# Patient Record
Sex: Male | Born: 1970 | Race: White | Hispanic: No | Marital: Single | Smoking: Current every day smoker
Health system: Southern US, Community
[De-identification: ages and names within clinical notes are randomized; demographics above are authoritative.]

## PROBLEM LIST (undated history)

## (undated) DIAGNOSIS — I10 Essential (primary) hypertension: Secondary | ICD-10-CM

## (undated) DIAGNOSIS — M109 Gout, unspecified: Secondary | ICD-10-CM

## (undated) DIAGNOSIS — F419 Anxiety disorder, unspecified: Secondary | ICD-10-CM

## (undated) HISTORY — PX: KNEE ARTHROCENTESIS: SUR44

## (undated) HISTORY — PX: APPENDECTOMY: SHX54

## (undated) HISTORY — PX: SHOULDER ARTHROSCOPY: SHX128

---

## 2000-08-18 ENCOUNTER — Encounter: Payer: Self-pay | Admitting: Neurosurgery

## 2000-08-18 ENCOUNTER — Ambulatory Visit (HOSPITAL_COMMUNITY): Admission: RE | Admit: 2000-08-18 | Discharge: 2000-08-18 | Payer: Self-pay | Admitting: Neurosurgery

## 2000-09-02 ENCOUNTER — Encounter: Payer: Self-pay | Admitting: Neurosurgery

## 2000-09-02 ENCOUNTER — Ambulatory Visit (HOSPITAL_COMMUNITY): Admission: RE | Admit: 2000-09-02 | Discharge: 2000-09-02 | Payer: Self-pay | Admitting: Neurosurgery

## 2005-12-12 ENCOUNTER — Emergency Department (HOSPITAL_COMMUNITY): Admission: EM | Admit: 2005-12-12 | Discharge: 2005-12-12 | Payer: Self-pay | Admitting: Emergency Medicine

## 2006-04-02 ENCOUNTER — Emergency Department (HOSPITAL_COMMUNITY): Admission: EM | Admit: 2006-04-02 | Discharge: 2006-04-02 | Payer: Self-pay | Admitting: Emergency Medicine

## 2006-07-09 ENCOUNTER — Emergency Department (HOSPITAL_COMMUNITY): Admission: EM | Admit: 2006-07-09 | Discharge: 2006-07-09 | Payer: Self-pay | Admitting: Emergency Medicine

## 2009-11-14 ENCOUNTER — Emergency Department (HOSPITAL_BASED_OUTPATIENT_CLINIC_OR_DEPARTMENT_OTHER): Admission: EM | Admit: 2009-11-14 | Discharge: 2009-11-14 | Payer: Self-pay | Admitting: Emergency Medicine

## 2009-11-14 ENCOUNTER — Ambulatory Visit: Payer: Self-pay | Admitting: Diagnostic Radiology

## 2010-01-08 ENCOUNTER — Emergency Department (HOSPITAL_COMMUNITY): Admission: EM | Admit: 2010-01-08 | Discharge: 2010-01-08 | Payer: Self-pay | Admitting: Emergency Medicine

## 2010-01-12 ENCOUNTER — Emergency Department (HOSPITAL_COMMUNITY): Admission: EM | Admit: 2010-01-12 | Discharge: 2010-01-12 | Payer: Self-pay | Admitting: Emergency Medicine

## 2010-03-18 ENCOUNTER — Emergency Department (HOSPITAL_COMMUNITY): Admission: EM | Admit: 2010-03-18 | Discharge: 2010-03-18 | Payer: Self-pay | Admitting: Emergency Medicine

## 2010-07-20 ENCOUNTER — Emergency Department (HOSPITAL_COMMUNITY)
Admission: EM | Admit: 2010-07-20 | Discharge: 2010-07-20 | Payer: Self-pay | Source: Home / Self Care | Admitting: Emergency Medicine

## 2010-11-23 ENCOUNTER — Emergency Department (HOSPITAL_COMMUNITY)
Admission: EM | Admit: 2010-11-23 | Discharge: 2010-11-23 | Disposition: A | Discharge status: Home / Self Care | Payer: Self-pay | Attending: Emergency Medicine | Admitting: Emergency Medicine

## 2010-11-23 DIAGNOSIS — L02619 Cutaneous abscess of unspecified foot: Secondary | ICD-10-CM | POA: Insufficient documentation

## 2010-11-23 DIAGNOSIS — I10 Essential (primary) hypertension: Secondary | ICD-10-CM | POA: Insufficient documentation

## 2010-11-23 DIAGNOSIS — Z79899 Other long term (current) drug therapy: Secondary | ICD-10-CM | POA: Insufficient documentation

## 2010-11-23 DIAGNOSIS — L03039 Cellulitis of unspecified toe: Secondary | ICD-10-CM | POA: Insufficient documentation

## 2010-12-16 ENCOUNTER — Emergency Department (HOSPITAL_COMMUNITY): Payer: Self-pay

## 2010-12-16 ENCOUNTER — Emergency Department (HOSPITAL_COMMUNITY)
Admission: EM | Admit: 2010-12-16 | Discharge: 2010-12-16 | Disposition: A | Discharge status: Home / Self Care | Payer: Self-pay | Attending: Emergency Medicine | Admitting: Emergency Medicine

## 2010-12-16 DIAGNOSIS — Z882 Allergy status to sulfonamides status: Secondary | ICD-10-CM | POA: Insufficient documentation

## 2010-12-16 DIAGNOSIS — L02619 Cutaneous abscess of unspecified foot: Secondary | ICD-10-CM | POA: Insufficient documentation

## 2010-12-16 DIAGNOSIS — I10 Essential (primary) hypertension: Secondary | ICD-10-CM | POA: Insufficient documentation

## 2010-12-16 DIAGNOSIS — F17200 Nicotine dependence, unspecified, uncomplicated: Secondary | ICD-10-CM | POA: Insufficient documentation

## 2010-12-16 DIAGNOSIS — F411 Generalized anxiety disorder: Secondary | ICD-10-CM | POA: Insufficient documentation

## 2010-12-16 DIAGNOSIS — J45909 Unspecified asthma, uncomplicated: Secondary | ICD-10-CM | POA: Insufficient documentation

## 2010-12-16 DIAGNOSIS — L03039 Cellulitis of unspecified toe: Secondary | ICD-10-CM | POA: Insufficient documentation

## 2010-12-16 DIAGNOSIS — E785 Hyperlipidemia, unspecified: Secondary | ICD-10-CM | POA: Insufficient documentation

## 2010-12-16 LAB — GLUCOSE, CAPILLARY

## 2013-10-15 ENCOUNTER — Emergency Department (HOSPITAL_COMMUNITY)
Admission: EM | Admit: 2013-10-15 | Discharge: 2013-10-15 | Disposition: A | Discharge status: Home / Self Care | Payer: Self-pay | Attending: Emergency Medicine | Admitting: Emergency Medicine

## 2013-10-15 ENCOUNTER — Emergency Department (HOSPITAL_COMMUNITY): Payer: Self-pay

## 2013-10-15 ENCOUNTER — Encounter (HOSPITAL_COMMUNITY): Payer: Self-pay | Admitting: Emergency Medicine

## 2013-10-15 DIAGNOSIS — M109 Gout, unspecified: Secondary | ICD-10-CM | POA: Insufficient documentation

## 2013-10-15 DIAGNOSIS — F17200 Nicotine dependence, unspecified, uncomplicated: Secondary | ICD-10-CM | POA: Insufficient documentation

## 2013-10-15 DIAGNOSIS — I10 Essential (primary) hypertension: Secondary | ICD-10-CM | POA: Insufficient documentation

## 2013-10-15 DIAGNOSIS — H7290 Unspecified perforation of tympanic membrane, unspecified ear: Secondary | ICD-10-CM | POA: Insufficient documentation

## 2013-10-15 DIAGNOSIS — Z79899 Other long term (current) drug therapy: Secondary | ICD-10-CM | POA: Insufficient documentation

## 2013-10-15 DIAGNOSIS — H6690 Otitis media, unspecified, unspecified ear: Secondary | ICD-10-CM | POA: Insufficient documentation

## 2013-10-15 DIAGNOSIS — R42 Dizziness and giddiness: Secondary | ICD-10-CM | POA: Insufficient documentation

## 2013-10-15 DIAGNOSIS — R05 Cough: Secondary | ICD-10-CM | POA: Insufficient documentation

## 2013-10-15 DIAGNOSIS — R059 Cough, unspecified: Secondary | ICD-10-CM | POA: Insufficient documentation

## 2013-10-15 DIAGNOSIS — R110 Nausea: Secondary | ICD-10-CM | POA: Insufficient documentation

## 2013-10-15 HISTORY — DX: Essential (primary) hypertension: I10

## 2013-10-15 HISTORY — DX: Gout, unspecified: M10.9

## 2013-10-15 MED ORDER — BENZONATATE 100 MG PO CAPS: 100.0000 mg | ORAL_CAPSULE | Freq: Three times a day (TID) | ORAL | Status: DC

## 2013-10-15 MED ORDER — HYDROCODONE-ACETAMINOPHEN 5-325 MG PO TABS: 1.0000 | ORAL_TABLET | ORAL | Status: DC | PRN

## 2013-10-15 MED ORDER — AMOXICILLIN-POT CLAVULANATE 875-125 MG PO TABS: 1.0000 | ORAL_TABLET | Freq: Two times a day (BID) | ORAL | Status: DC

## 2013-10-15 NOTE — Discharge Instructions (Signed)
It is important that you follow up with the ear, nose, throat doctor to be sure your ear clears and to test your hearing. Return here for any problems. Take your medication as directed. Do not take the narcotic if you are driving as it will make you sleepy.   Draining Ear Ear wax, pus, blood and other fluids are examples of the different types of drainage from ears. Drops or cream may be needed to lessen the itching which may occur with ear drainage. CAUSES   Skin irritations in the ear.  Ear infection.  Swimmer's ear.  Ruptured eardrum.  Foreign object in the ear canal.  Sudden pressure changes.  Head injury. HOME CARE INSTRUCTIONS   Only take over-the-counter or prescription medicines for pain, fever, or discomfort as directed by your caregiver.  Do not rub the ear canal with cotton-tipped swabs.  Do not swim until your caregiver says it is okay.  Before you take a shower, cover a cotton ball with petroleum jelly to keep water out.  Limit exposure to smoke. Secondhand smoke can increase the chance for ear infections.  Keep up with immunizations.  Wash your hands well.  Keep all follow-up appointments to examine the ear and evaluate hearing. SEEK MEDICAL CARE IF:   You have increased drainage.  You have ear pain, a fever, or drainage that is not getting better after 48 hours of antibiotics.  You are unusually tired. SEEK IMMEDIATE MEDICAL CARE IF:  You have severe ear pain or headache.  The patient is older than 3 months with a rectal or oral temperature of 102 F (38.9 C) or higher.  The patient is 213 months old or younger with a rectal temperature of 100.4 F (38 C) or higher.  You vomit.  You feel dizzy.  You have a seizure.  You have new hearing loss. MAKE SURE YOU:   Understand these instructions.  Will watch your condition.  Will get help right away if you are not doing well or get worse. Document Released: 06/22/2005 Document Revised:  09/14/2011 Document Reviewed: 04/25/2009 [facility] Patient Information 2014 [place]Care, [place].  Eardrum Perforation The eardrum is a thin, round tissue inside the ear. It allows you to hear. The eardrum can get torn (perforated). Eardrums often heal on their own. There is often little or no long-term hearing loss. HOME CARE   Keep your ear dry while it heals. Do not swim, dive, or take showers until your doctor says it is okay.  Before you take a bath, put petroleum jelly all over a cotton ball. Put the cotton ball in your ear. This will keep water out.  Only take medicines as told by your doctor.  Blow your nose gently.  Continue normal activities after your eardrum heals. Your doctor will tell you when your eardrum has healed.  Talk to your doctor before flying on an airplane.  Keep all doctor visits as told. This is important. GET HELP RIGHT AWAY IF:   You have blood or yellowish-white fluid (pus) coming from your ear.  You feel off balance.  You feel dizzy, sick to your stomach (nauseous), or you throw up (vomit).  You have more pain.  You have a fever. MAKE SURE YOU:   Understand these instructions.  Will watch your condition.  Will get help right away if you are not doing well or get worse. Document Released: 12/10/2009 Document Revised: 09/14/2011 Document Reviewed: 12/10/2009 [facility] Patient Information 2014 [place]ExitCare, [place]LLC.

## 2013-10-15 NOTE — ED Provider Notes (Signed)
CSN: [id number]     Arrival date & time 10/15/13  1245 History  This chart was scribed for non-physician practitioner, Kerrie BuffaloHope Neese, FNP,working with Benny LennertJoseph L Zammit, MD, by Karle PlumberJennifer Tensley, ED Scribe.  This patient was seen in room APFT21/APFT21 and the patient's care was started at 2:03 PM.  Chief Complaint  Patient presents with  . Ear Drainage   The history is provided by the patient. No language interpreter was used.   HPI Comments:  Shauna HughKevin D Clymer is a 43 y.o. male who presents to the Emergency Department complaining of productive cough of yellowish-green phlegm and congestion that started approximately four days ago. He reports bilateral ear bleeding and pain that started two days ago. He reports fever ranging from 100-101 degrees. He reports sinus congestion, mild nausea and dizziness. Pt states he had a cold about one week ago. 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 He denies vomiting.  Past Medical History  Diagnosis Date  . Hypertension   . Gout    Past Surgical History  Procedure Laterality Date  . Knee arthrocentesis    . Shoulder arthroscopy     History reviewed. No pertinent family history. History  Substance Use Topics  . Smoking status: Current Every Day Smoker -- 2.00 packs/day  . Smokeless tobacco: Not on file  . Alcohol Use: Not on file    Review of Systems  Constitutional: Positive for fever.  HENT: Positive for congestion, ear discharge (bilateral), ear pain (right) and sinus pressure. Negative for facial swelling, sore throat and trouble swallowing.   Eyes: Negative for visual disturbance.  Respiratory: Positive for cough. Negative for shortness of breath.   Cardiovascular: Negative for chest pain.  Gastrointestinal: Positive for nausea. Negative for vomiting and abdominal pain.  Musculoskeletal: Negative for neck stiffness.  Skin: Negative for rash.  Neurological: Positive for dizziness. Negative for seizures.  Psychiatric/Behavioral:  Negative for confusion.    Allergies  Sulfur  Home Medications   Current Outpatient Rx  Name  Route  Sig  Dispense  Refill  . bumetanide (BUMEX) 1 MG tablet   Oral   Take 1 mg by mouth daily.         . DULoxetine (CYMBALTA) 60 MG capsule   Oral   Take 60 mg by mouth daily.         . indomethacin (INDOCIN) 50 MG capsule   Oral   Take 50 mg by mouth 2 (two) times daily as needed (gout flareups).         Marland Kitchen. PRESCRIPTION MEDICATION      Amlodipine but patient does not know dose.  States he is also on another blood pressure medication but is not sure of name. His pharmacy is closed today.          Triage Vitals: BP 146/82  Pulse 63  Temp(Src) 97.7 F (36.5 C) (Oral)  Resp 18  Ht 6\' 4"  (1.93 m)  Wt 290 lb (131.543 kg)  BMI 35.31 kg/m2  SpO2 99% Physical Exam  Nursing note and vitals reviewed. Constitutional: He is oriented to person, place, and time. He appears well-developed and well-nourished.  HENT:  Right Ear: There is drainage and tenderness. Tympanic membrane is perforated. Decreased hearing is noted.  Left Ear: There is drainage and tenderness. Tympanic membrane is perforated. Decreased hearing is noted.  Eyes: Conjunctivae and EOM are normal.  Neck: Neck supple.  Cardiovascular: Normal rate and regular rhythm.   Pulmonary/Chest: Effort normal. He has rhonchi.  Abdominal: Soft.  There is no tenderness.  Musculoskeletal: Normal range of motion.  Neurological: He is alert and oriented to person, place, and time. No cranial nerve deficit.  Skin: Skin is warm and dry.  Psychiatric: He has a normal mood and affect. His behavior is normal.    ED Course  Procedures (including critical care time) DIAGNOSTIC STUDIES: Oxygen Saturation is 99% on RA, normal by my interpretation.   COORDINATION OF CARE: 2:10 PM- Will order CXR and prescribe antibiotics and pain medication. Pt verbalizes understanding and agrees to plan.  Medications - No data to display  Labs  Review Labs Reviewed - No data to display Imaging Review Dg Chest 2 View  10/15/2013   CLINICAL DATA:  Cough for 4 days  EXAM: CHEST  2 VIEW  COMPARISON:  DG CHEST 2 VIEW dated 07/09/2006  FINDINGS: Mild cardiac enlargement stable. Vascular pattern normal. Lungs clear. No pleural effusions.  IMPRESSION: No active cardiopulmonary disease.   Electronically Signed   By: Esperanza Heir M.D.   On: 10/15/2013 14:38    MDM   43 y.o. male with bilateral otitis media and drainage from ears. Will treat with antibiotics and pain medication. Discussed in detail with the patient need for follow up with ENT as soon as possible. Discussed hearing loss associated with ear infections in adults.  Patient voices understanding.  I have reviewed this patient's vital signs, nurses notes and discussed clinical findings with the patient and plan of care. He voices understanding.    Medication List    TAKE these medications       amoxicillin-clavulanate 875-125 MG per tablet  Commonly known as:  AUGMENTIN  Take 1 tablet by mouth 2 (two) times daily.     benzonatate 100 MG capsule  Commonly known as:  TESSALON  Take 1 capsule (100 mg total) by mouth every 8 (eight) hours.     HYDROcodone-acetaminophen 5-325 MG per tablet  Commonly known as:  NORCO/VICODIN  Take 1 tablet by mouth every 4 (four) hours as needed.      ASK your doctor about these medications       bumetanide 1 MG tablet  Commonly known as:  BUMEX  Take 1 mg by mouth daily.     DULoxetine 60 MG capsule  Commonly known as:  CYMBALTA  Take 60 mg by mouth daily.     indomethacin 50 MG capsule  Commonly known as:  INDOCIN  Take 50 mg by mouth 2 (two) times daily as needed (gout flareups).     PRESCRIPTION MEDICATION  Amlodipine but patient does not know dose.  States he is also on another blood pressure medication but is not sure of name. His pharmacy is closed today.        I personally performed the services described in this  documentation, which was scribed in my presence. The recorded information has been reviewed and is accurate.    [facility] Orlene Och, NP 10/15/13 1700

## 2013-10-15 NOTE — ED Notes (Signed)
Pt s/o reports pt has had cough x4 days. Pt has been taking otc mucinex with no relief. Pt reports muffled sounds,drainage and bleeding from bilateral ears started yesterday. Pt denies any known injury. Pt reports dizziness, right ear pain. Pt s/o reports that pt ruptured both ear drums x3 years ago.

## 2013-10-18 NOTE — ED Provider Notes (Signed)
Medical screening examination/treatment/procedure(s) were performed by non-physician practitioner and as supervising physician I was immediately available for consultation/collaboration.   EKG Interpretation None        Benny LennertJoseph L Marshia Tropea, MD 10/18/13 [phone]

## 2014-02-02 ENCOUNTER — Other Ambulatory Visit: Payer: Self-pay | Admitting: Occupational Medicine

## 2014-02-02 ENCOUNTER — Ambulatory Visit: Payer: Self-pay

## 2014-02-02 DIAGNOSIS — R52 Pain, unspecified: Secondary | ICD-10-CM

## 2014-02-07 ENCOUNTER — Other Ambulatory Visit: Payer: Self-pay | Admitting: Occupational Medicine

## 2014-02-07 ENCOUNTER — Ambulatory Visit: Payer: Self-pay

## 2014-02-07 DIAGNOSIS — R52 Pain, unspecified: Secondary | ICD-10-CM

## 2014-11-25 ENCOUNTER — Emergency Department (HOSPITAL_COMMUNITY)
Admission: EM | Admit: 2014-11-25 | Discharge: 2014-11-25 | Disposition: A | Discharge status: Home / Self Care | Payer: BLUE CROSS/BLUE SHIELD | Attending: Emergency Medicine | Admitting: Emergency Medicine

## 2014-11-25 ENCOUNTER — Encounter (HOSPITAL_COMMUNITY): Payer: Self-pay | Admitting: Emergency Medicine

## 2014-11-25 DIAGNOSIS — Z791 Long term (current) use of non-steroidal anti-inflammatories (NSAID): Secondary | ICD-10-CM | POA: Diagnosis not present

## 2014-11-25 DIAGNOSIS — I10 Essential (primary) hypertension: Secondary | ICD-10-CM | POA: Diagnosis not present

## 2014-11-25 DIAGNOSIS — M109 Gout, unspecified: Secondary | ICD-10-CM | POA: Diagnosis not present

## 2014-11-25 DIAGNOSIS — F419 Anxiety disorder, unspecified: Secondary | ICD-10-CM | POA: Insufficient documentation

## 2014-11-25 DIAGNOSIS — Z79899 Other long term (current) drug therapy: Secondary | ICD-10-CM | POA: Insufficient documentation

## 2014-11-25 DIAGNOSIS — Z720 Tobacco use: Secondary | ICD-10-CM | POA: Insufficient documentation

## 2014-11-25 DIAGNOSIS — Z760 Encounter for issue of repeat prescription: Secondary | ICD-10-CM | POA: Diagnosis present

## 2014-11-25 HISTORY — DX: Anxiety disorder, unspecified: F41.9

## 2014-11-25 MED ORDER — BISOPROLOL-HYDROCHLOROTHIAZIDE 5-6.25 MG PO TABS: 1.0000 | ORAL_TABLET | Freq: Two times a day (BID) | ORAL | Status: DC

## 2014-11-25 MED ORDER — BENAZEPRIL HCL 40 MG PO TABS: 40.0000 mg | ORAL_TABLET | Freq: Every day | ORAL | Status: DC

## 2014-11-25 MED ORDER — AMLODIPINE BESY-BENAZEPRIL HCL 5-40 MG PO CAPS: 1.0000 | ORAL_CAPSULE | Freq: Every day | ORAL | Status: DC

## 2014-11-25 MED ORDER — AMLODIPINE BESYLATE 5 MG PO TABS
5.0000 mg | ORAL_TABLET | Freq: Once | ORAL | Status: AC
  Administered 2014-11-25: 5 mg via ORAL
  Filled 2014-11-25: qty 1

## 2014-11-25 NOTE — ED Provider Notes (Signed)
CSN: [id number]     Arrival date & time 11/25/14  1315 History   First MD Initiated Contact with Patient 11/25/14 1348     Chief Complaint  Patient presents with  . Medication Refill     (Consider location/radiation/quality/duration/timing/severity/associated sxs/prior Treatment) HPI Comments: The patient reports being out of his blood pressure medication for a couple of days, he is also out of other medications, he cannot see a family doctor in the area as he has not established patient, he is working on doing that. He denies any complaints, he states that his blood pressure is high today but he does not have any ill feelings including no chest pain shortness of breath or headaches. Request blood pressure medication refill.  The history is provided by the patient.    Past Medical History  Diagnosis Date  . Hypertension   . Gout   . Anxiety    Past Surgical History  Procedure Laterality Date  . Knee arthrocentesis    . Shoulder arthroscopy     Family History  Problem Relation Age of Onset  . Hypertension Mother   . Cancer Mother   . Heart failure Father   . Hypertension Other   . Diabetes Other   . Heart attack Other    History  Substance Use Topics  . Smoking status: Current Every Day Smoker -- 1.50 packs/day    Types: Cigarettes  . Smokeless tobacco: Never Used  . Alcohol Use: No    Review of Systems  Constitutional: Negative for fever.  Respiratory: Negative for shortness of breath.   Cardiovascular: Negative for chest pain.  Neurological: Negative for light-headedness and headaches.      Allergies  Sulfa antibiotics and Sulfur  Home Medications   Prior to Admission medications   Medication Sig Start Date End Date Taking? Authorizing Provider  amLODipine-benazepril (LOTREL) 5-40 MG per capsule Take 1 capsule by mouth daily. 11/25/14   Eber Hong, MD  bisoprolol-hydrochlorothiazide [facility]) 5-6.25 MG per tablet Take 1 tablet by mouth 2 (two) times daily.  11/25/14   Eber Hong, MD  bumetanide (BUMEX) 1 MG tablet Take 1 mg by mouth daily.    Historical Provider, MD  DULoxetine (CYMBALTA) 60 MG capsule Take 60 mg by mouth daily.    Historical Provider, MD  indomethacin (INDOCIN) 50 MG capsule Take 50 mg by mouth 2 (two) times daily as needed (gout flareups).    Historical Provider, MD   BP 161/85 mmHg  Pulse 77  Temp(Src) 98.2 F (36.8 C) (Oral)  Resp 20  Ht 6' (1.829 m)  Wt 285 lb (129.275 kg)  BMI 38.64 kg/m2  SpO2 98% Physical Exam  Constitutional: He appears well-developed and well-nourished.  HENT:  Head: Normocephalic and atraumatic.  Eyes: Conjunctivae are normal. Right eye exhibits no discharge. Left eye exhibits no discharge.  Cardiovascular:  Clear heart and lung sounds, no murmurs rubs or gallops  Pulmonary/Chest: Effort normal. No respiratory distress.  Neurological: He is alert. Coordination normal.  Normal speech and gait  Skin: Skin is warm and dry. No rash noted. He is not diaphoretic. No erythema.  Psychiatric: He has a normal mood and affect.  Nursing note and vitals reviewed.   ED Course  Procedures (including critical care time) Labs Review Labs Reviewed - No data to display  Imaging Review No results found.   MDM   Final diagnoses:  Essential hypertension    Medications refilled for the patient, amlodipine benazepril combination pill and bisoprolol hydrochlorothiazide, and mentation  pill, the patient is in agreement with the plan, he will be given medications prior to discharge.  Meds given in ED:  Medications  amLODipine (NORVASC) tablet 5 mg (not administered)  benazepril (LOTENSIN) tablet 40 mg (not administered)       Eber HongBrian Gregori Abril, MD 11/25/14 1353

## 2014-11-25 NOTE — Discharge Instructions (Signed)
Please call your doctor for a followup appointment within 24-48 hours. When you talk to your doctor please let them know that you were seen in the emergency department and have them acquire all of your records so that they can discuss the findings with you and formulate a treatment plan to fully care for your new and ongoing problems. ° °[facility] ° ° ° °Edward Hawkins MD. Specialty: Pulmonary Disease Contact information: [address]  °[address]  °[place] [address]  °[phone]  ° °Margaret Simpson, MD. Specialty: Family Medicine Contact information: [address]  °[place] [address]  °[phone]  ° °Scott Luking, MD. Specialty: Family Medicine Contact information: [address]  °[address]  °[place] [address]  °[phone]  ° °Tesfaye Fanta, MD Specialty: Internal Medicine Contact information: [address]  °[place] [address]  °[phone]  ° °Zach Hall, MD. Specialty: Internal Medicine Contact information: [address]  °[place] [address]  °[phone]  ° °Angus Mcinnis, MD. Specialty: Family Medicine Contact information: [address]  °[place] [address]  °[phone]  ° °Stephen Knowlton, MD. Specialty: Family Medicine Contact information: [address]  °[address]  °[place] [address]  °[phone]  ° °Roy Fagan, MD. Specialty: Internal Medicine Contact information: [address]  °[address]  °[place]  [address]  °[phone]  ° ° °

## 2014-11-25 NOTE — ED Notes (Signed)
Pt reports being out of his blood pressure medication. Pt states he has been unable to get in to see a Dr. Rock NephewPt concerned about his BP.

## 2014-11-25 NOTE — ED Notes (Signed)
MD Miller at bedside. 

## 2014-12-09 ENCOUNTER — Emergency Department (HOSPITAL_COMMUNITY)
Admission: EM | Admit: 2014-12-09 | Discharge: 2014-12-09 | Disposition: A | Discharge status: Home / Self Care | Payer: BLUE CROSS/BLUE SHIELD | Attending: Emergency Medicine | Admitting: Emergency Medicine

## 2014-12-09 ENCOUNTER — Emergency Department (HOSPITAL_COMMUNITY): Payer: BLUE CROSS/BLUE SHIELD

## 2014-12-09 ENCOUNTER — Encounter (HOSPITAL_COMMUNITY): Payer: Self-pay | Admitting: Emergency Medicine

## 2014-12-09 DIAGNOSIS — J069 Acute upper respiratory infection, unspecified: Secondary | ICD-10-CM

## 2014-12-09 DIAGNOSIS — Z79899 Other long term (current) drug therapy: Secondary | ICD-10-CM | POA: Diagnosis not present

## 2014-12-09 DIAGNOSIS — J40 Bronchitis, not specified as acute or chronic: Secondary | ICD-10-CM | POA: Insufficient documentation

## 2014-12-09 DIAGNOSIS — F419 Anxiety disorder, unspecified: Secondary | ICD-10-CM | POA: Diagnosis not present

## 2014-12-09 DIAGNOSIS — I10 Essential (primary) hypertension: Secondary | ICD-10-CM | POA: Insufficient documentation

## 2014-12-09 DIAGNOSIS — M109 Gout, unspecified: Secondary | ICD-10-CM | POA: Insufficient documentation

## 2014-12-09 DIAGNOSIS — R0981 Nasal congestion: Secondary | ICD-10-CM | POA: Diagnosis present

## 2014-12-09 DIAGNOSIS — H9202 Otalgia, left ear: Secondary | ICD-10-CM | POA: Diagnosis not present

## 2014-12-09 DIAGNOSIS — Z720 Tobacco use: Secondary | ICD-10-CM | POA: Diagnosis not present

## 2014-12-09 MED ORDER — HYDROCODONE-HOMATROPINE 5-1.5 MG/5ML PO SYRP: 5.0000 mL | ORAL_SOLUTION | Freq: Four times a day (QID) | ORAL | Status: DC | PRN

## 2014-12-09 MED ORDER — PREDNISONE 10 MG PO TABS
ORAL_TABLET | ORAL | Status: AC
  Filled 2014-12-09: qty 1

## 2014-12-09 MED ORDER — PREDNISONE 50 MG PO TABS
60.0000 mg | ORAL_TABLET | Freq: Once | ORAL | Status: AC
  Administered 2014-12-09: 60 mg via ORAL
  Filled 2014-12-09 (×2): qty 1

## 2014-12-09 MED ORDER — OXYMETAZOLINE HCL 0.05 % NA SOLN
2.0000 | Freq: Once | NASAL | Status: AC
  Administered 2014-12-09: 2 via NASAL
  Filled 2014-12-09: qty 15

## 2014-12-09 MED ORDER — PREDNISONE 10 MG PO TABS: ORAL_TABLET | ORAL | Status: DC

## 2014-12-09 MED ORDER — ALBUTEROL SULFATE HFA 108 (90 BASE) MCG/ACT IN AERS
2.0000 | INHALATION_SPRAY | Freq: Once | RESPIRATORY_TRACT | Status: AC
  Administered 2014-12-09: 2 via RESPIRATORY_TRACT
  Filled 2014-12-09: qty 6.7

## 2014-12-09 NOTE — ED Notes (Signed)
Pt states that he has been having a lot of nasal congestion, left ear pain, and coughing for the past 2 days.

## 2014-12-09 NOTE — Discharge Instructions (Signed)
Your oxygen level is 98% on room air. Your chest x-ray is negative for pneumonia or other acute or emergent lung changes. Your examination suggest bronchitis and upper respiratory infection. Please use Afrin spray into each nostril every 8 hours for 5 days only. Please use Decadron daily with food. Please use albuterol 2 puffs every 4 hours to assist with breathing and if any wheezing noted. May use Hycodan for cough. Hycodan may cause drowsiness, please do not machinery, operate a vehicle, drink alcohol, but is patent activities that require concentration while taking this medication. Please see your primary physician for additional evaluation and management of this issue. Cough, Adult  A cough is a reflex. It helps you clear your throat and airways. A cough can help heal your body. A cough can last 2 or 3 weeks (acute) or may last more than 8 weeks (chronic). Some common causes of a cough can include an infection, allergy, or a cold. HOME CARE  Only take medicine as told by your doctor.  If given, take your medicines (antibiotics) as told. Finish them even if you start to feel better.  Use a cold steam vaporizer or humidifier in your home. This can help loosen thick spit (secretions).  Sleep so you are almost sitting up (semi-upright). Use pillows to do this. This helps reduce coughing.  Rest as needed.  Stop smoking if you smoke. GET HELP RIGHT AWAY IF:  You have yellowish-white fluid (pus) in your thick spit.  Your cough gets worse.  Your medicine does not reduce coughing, and you are losing sleep.  You cough up blood.  You have trouble breathing.  Your pain gets worse and medicine does not help.  You have a fever. MAKE SURE YOU:   Understand these instructions.  Will watch your condition.  Will get help right away if you are not doing well or get worse. Document Released: 03/05/2011 Document Revised: 11/06/2013 Document Reviewed: 03/05/2011 [facility] Patient Information  2015 [place]ExitCare, [place]LL[place]. This information is not intended to replace advice given to you by your health care provider. Make sure you discuss any questions you have with your health care provider. You were

## 2014-12-09 NOTE — ED Provider Notes (Signed)
CSN: [id number]     Arrival date & time 12/09/14  1615 History  This chart was scribed for non-physician provider Ivery QualeHobson Rosealynn Mateus, PA-C, working with Blane OharaJoshua Zavitz, MD by Phillis HaggisGabriella Gaje, ED Scribe. This patient was seen in room APFT23/APFT23 and patient care was started at 4:33 PM.   Chief Complaint  Patient presents with  . Nasal Congestion   Patient is a 44 y.o. male presenting with cough. The history is provided by the patient. No language interpreter was used.  Cough Duration:  4 days Progression:  Worsening Chronicity:  New Ineffective treatments:  None tried Associated symptoms: ear pain and shortness of breath   HPI Comments: Dave Rowland is a 44 y.o. male who presents to the Emergency Department complaining of left otalgia, cough, and congestion onset 4 days ago. He states that he has a history of allergies that tend to flare up around this time of the year; states that the constant coughing causes him to have pain in his left ear, some SOB and lightheadedness. He states that he had asthma when he was younger, but is not currently being treated for asthma. 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   Past Medical History  Diagnosis Date  . Hypertension   . Gout   . Anxiety    Past Surgical History  Procedure Laterality Date  . Knee arthrocentesis    . Shoulder arthroscopy    . Appendectomy     Family History  Problem Relation Age of Onset  . Hypertension Mother   . Cancer Mother   . Heart failure Father   . Hypertension Other   . Diabetes Other   . Heart attack Other    History  Substance Use Topics  . Smoking status: Current Every Day Smoker -- 1.00 packs/day    Types: Cigarettes  . Smokeless tobacco: Never Used  . Alcohol Use: No    Review of Systems  HENT: Positive for congestion and ear pain.    Respiratory: Positive for cough and shortness of breath.   Neurological: Positive for light-headedness.  All other systems reviewed and are negative.  Allergies  Sulfa antibiotics and Sulfur  Home Medications   Prior to Admission medications   Medication Sig Start Date End Date Taking? Authorizing Provider  amLODipine-benazepril (LOTREL) 5-40 MG per capsule Take 1 capsule by mouth daily. 11/25/14   Eber HongBrian Miller, MD  bisoprolol-hydrochlorothiazide [facility]) 5-6.25 MG per tablet Take 1 tablet by mouth 2 (two) times daily. 11/25/14   Eber HongBrian Miller, MD  bumetanide (BUMEX) 1 MG tablet Take 1 mg by mouth daily.    Historical Provider, MD  DULoxetine (CYMBALTA) 60 MG capsule Take 60 mg by mouth daily.    Historical Provider, MD  indomethacin (INDOCIN) 50 MG capsule Take 50 mg by mouth 2 (two) times daily as needed (gout flareups).    Historical Provider, MD   BP 176/92 mmHg  Pulse 62  Resp 20  Ht 6' (1.829 m)  Wt 285 lb (129.275 kg)  BMI 38.64 kg/m2  SpO2 99%  Physical Exam  Constitutional: He is oriented to person, place, and time. He appears well-developed and well-nourished. No distress.  HENT:  Head: Normocephalic and atraumatic.  Right Ear: Tympanic membrane normal. No drainage. Tympanic membrane is not perforated.  Left Ear: Tympanic membrane normal. No drainage. Tympanic  membrane is not perforated.  Nasal congestion present  Eyes: EOM and lids are normal. Pupils are equal, round, and reactive to light.  Mild increased redness of conjunctiva  Neck: Normal range of motion. Neck supple.  Cardiovascular: Normal rate, regular rhythm and normal heart sounds.   Pulmonary/Chest: Effort normal. He has rhonchi.  Symmetrical rise and fall of chest, coarse breath sounds throughout, occasional rhonchi, pt talks in full sentences.  Musculoskeletal: Normal range of motion. He exhibits no edema.  Cap refill < 2 secs  Neurological: He is alert and oriented to person, place, and time.  Skin: Skin  is warm and dry.  Psychiatric: He has a normal mood and affect. His behavior is normal.  Nursing note and vitals reviewed.   ED Course  Procedures (including critical care time) DIAGNOSTIC STUDIES: Oxygen Saturation is 99% on room air, normal by my interpretation.    COORDINATION OF CARE: 4:39 PM-Discussed treatment plan which includes medications and chest x-ray with pt at bedside and pt agreed to plan.   5:31 PM- will give pt work note for 3 days  Labs Review Labs Reviewed - No data to display  Imaging Review Dg Chest 2 View  12/09/2014   CLINICAL DATA:  Cough and shortness of breath.  EXAM: CHEST  2 VIEW  COMPARISON:  02/07/2014 and 10/15/2013  FINDINGS: Borderline cardiomegaly. Pulmonary vascularity is normal. The lungs are clear. No effusions. No osseous abnormality.  IMPRESSION: No acute abnormality.  Borderline cardiomegaly.   Electronically Signed   By: Francene Boyers M.D.   On: 12/09/2014 16:56     EKG Interpretation None      MDM  Vital signs are well within normal limits. Pulse oximetry is 98% on room air. Within normal limits by my interpretation. The patient states that he has a similar episode once or twice a year on, aggravated mostly by allergies. There's been no hemoptysis reported, and no high fevers reported. Chest x-ray is read as negative by the radiologist. The patient speaks in complete sentences without problem. Patient will be treated with aspirin for nasal congestion, albuterol and prednisone as well as Hycodan for cough. The patient is given a work excuse to return on June 9. The patient is to see his primary physician, or return to the emergency department if not improving.    Final diagnoses:  None    *I have reviewed nursing notes, vital signs, and all appropriate lab and imaging results for this patient.** **I personally performed the services described in this documentation, which was scribed in my presence. The recorded information has been  reviewed and is accurate.Ivery Quale, PA-C 12/09/14 1735  Blane Ohara, MD 12/10/14 [phone]

## 2014-12-30 ENCOUNTER — Emergency Department (HOSPITAL_COMMUNITY)
Admission: EM | Admit: 2014-12-30 | Discharge: 2014-12-30 | Disposition: A | Discharge status: Home / Self Care | Payer: BLUE CROSS/BLUE SHIELD | Attending: Emergency Medicine | Admitting: Emergency Medicine

## 2014-12-30 ENCOUNTER — Encounter (HOSPITAL_COMMUNITY): Payer: Self-pay | Admitting: *Deleted

## 2014-12-30 DIAGNOSIS — Z720 Tobacco use: Secondary | ICD-10-CM | POA: Insufficient documentation

## 2014-12-30 DIAGNOSIS — Z79899 Other long term (current) drug therapy: Secondary | ICD-10-CM | POA: Diagnosis not present

## 2014-12-30 DIAGNOSIS — N492 Inflammatory disorders of scrotum: Secondary | ICD-10-CM | POA: Diagnosis not present

## 2014-12-30 DIAGNOSIS — I10 Essential (primary) hypertension: Secondary | ICD-10-CM | POA: Insufficient documentation

## 2014-12-30 DIAGNOSIS — Z8739 Personal history of other diseases of the musculoskeletal system and connective tissue: Secondary | ICD-10-CM | POA: Insufficient documentation

## 2014-12-30 DIAGNOSIS — F419 Anxiety disorder, unspecified: Secondary | ICD-10-CM | POA: Diagnosis not present

## 2014-12-30 DIAGNOSIS — L089 Local infection of the skin and subcutaneous tissue, unspecified: Secondary | ICD-10-CM | POA: Diagnosis present

## 2014-12-30 MED ORDER — DOXYCYCLINE HYCLATE 100 MG PO CAPS: 100.0000 mg | ORAL_CAPSULE | Freq: Two times a day (BID) | ORAL | Status: DC

## 2014-12-30 MED ORDER — LIDOCAINE HCL (PF) 2 % IJ SOLN
INTRAMUSCULAR | Status: AC
  Filled 2014-12-30: qty 10

## 2014-12-30 MED ORDER — LIDOCAINE HCL (PF) 2 % IJ SOLN
10.0000 mL | Freq: Once | INTRAMUSCULAR | Status: AC
  Administered 2014-12-30: 05:00:00

## 2014-12-30 MED ORDER — DOXYCYCLINE HYCLATE 100 MG PO TABS
100.0000 mg | ORAL_TABLET | Freq: Once | ORAL | Status: AC
  Administered 2014-12-30: 100 mg via ORAL
  Filled 2014-12-30: qty 1

## 2014-12-30 NOTE — ED Notes (Signed)
Dressing applied to scrotum

## 2014-12-30 NOTE — ED Notes (Signed)
Pt reports having abscess on his scrotum that has gotten bigger over the past 2 days.

## 2014-12-30 NOTE — ED Provider Notes (Signed)
CSN: [id number]     Arrival date & time 12/30/14  0415 History   First MD Initiated Contact with Patient 12/30/14 [id number]     Chief Complaint  Patient presents with  . Recurrent Skin Infections     (Consider location/radiation/quality/duration/timing/severity/associated sxs/prior Treatment) The history is provided by the patient.   44 year old male comes in with painful mass on the left side of his scrotum that has been getting larger. It first appeared 3 days ago. He rates pain at 8/10. He denies fever or chills. Denies any trauma. He is not had any treatment.   Past Medical History  Diagnosis Date  . Hypertension   . Gout   . Anxiety    Past Surgical History  Procedure Laterality Date  . Knee arthrocentesis    . Shoulder arthroscopy    . Appendectomy     Family History  Problem Relation Age of Onset  . Hypertension Mother   . Cancer Mother   . Heart failure Father   . Hypertension Other   . Diabetes Other   . Heart attack Other    History  Substance Use Topics  . Smoking status: Current Every Day Smoker -- 1.00 packs/day    Types: Cigarettes  . Smokeless tobacco: Never Used  . Alcohol Use: No    Review of Systems  All other systems reviewed and are negative.     Allergies  Sulfa antibiotics and Sulfur  Home Medications   Prior to Admission medications   Medication Sig Start Date End Date Taking? Authorizing Provider  amLODipine-benazepril (LOTREL) 5-40 MG per capsule Take 1 capsule by mouth daily. 11/25/14  Yes Eber Hong, MD  bisoprolol-hydrochlorothiazide [facility]) 5-6.25 MG per tablet Take 1 tablet by mouth 2 (two) times daily. 11/25/14  Yes Eber Hong, MD  bumetanide (BUMEX) 1 MG tablet Take 1 mg by mouth daily.   Yes Historical Provider, MD  cetirizine (ZYRTEC) 10 MG tablet Take 10 mg by mouth once.   Yes Historical Provider, MD  DULoxetine (CYMBALTA) 60 MG capsule Take 60 mg by mouth daily.   Yes Historical Provider, MD  HYDROcodone-homatropine  (HYCODAN) 5-1.5 MG/5ML syrup Take 5 mLs by mouth every 6 (six) hours as needed. 12/09/14  Yes Ivery Quale, PA-C  indomethacin (INDOCIN) 50 MG capsule Take 50 mg by mouth 2 (two) times daily as needed (gout flareups).   Yes Historical Provider, MD  ketotifen (ZADITOR) 0.025 % ophthalmic solution Place 1 drop into both eyes daily as needed (Eye Allergies).    Historical Provider, MD  loratadine (CLARITIN) 10 MG tablet Take 10 mg by mouth once.    Historical Provider, MD  predniSONE (DELTASONE) 10 MG tablet 5,4,3,2,1 - take with food 12/09/14   Ivery Quale, PA-C   BP 177/85 mmHg  Pulse 95  Temp(Src) 99.4 F (37.4 C) (Oral)  Resp 18  Ht 6' (1.829 m)  Wt 285 lb (129.275 kg)  BMI 38.64 kg/m2  SpO2 97% Physical Exam  Nursing note and vitals reviewed.  44 year old male, resting comfortably and in no acute distress. Vital signs are significant for hypertension. Oxygen saturation is 97%, which is normal. Head is normocephalic and atraumatic. PERRLA, EOMI. Oropharynx is clear. Neck is nontender and supple without adenopathy or JVD. Back is nontender and there is no CVA tenderness. Lungs are clear without rales, wheezes, or rhonchi. Chest is nontender. Heart has regular rate and rhythm without murmur. Abdomen is soft, flat, nontender without masses or hepatosplenomegaly and peristalsis is normoactive. Genitalia: Large  left scrotal mass appearance of the scrotal wall with moderate tenderness. There is slight erythema the overlying scrotum. Testes are descended without masses without tenderness. Extremities have no cyanosis or edema, full range of motion is present. Skin is warm and dry without rash. Neurologic: Mental status is normal, cranial nerves are intact, there are no motor or sensory deficits.  ED Course  Procedures (including critical care time) INCISION AND DRAINAGE Performed by: ZOXWR,UEAVW Consent: Verbal consent obtained. Risks and benefits: risks, benefits and alternatives were  discussed Type: abscess  Body area: Scrotum  Anesthesia: local infiltration  Incision was made with a scalpel.  Local anesthetic: lidocaine 2% without epinephrine  Anesthetic total: 3 ml  Complexity: complex Blunt dissection to break up loculations  Drainage: purulent and foul-smelling   Drainage amount: Large   Packing material: none  Patient tolerance: Patient tolerated the procedure well with no immediate complications.     MDM   Final diagnoses:  Scrotal abscess    Scrotal abscess. And concerned that it may not have been completely drained. He is discharged with prescription for doxycycline and is referred to urology for follow-up.    Dione Booze, MD 12/30/14 [phone]

## 2014-12-30 NOTE — Discharge Instructions (Signed)
Abscess An abscess is an infected area that contains a collection of pus and debris.It can occur in almost any part of the body. An abscess is also known as a furuncle or boil. CAUSES  An abscess occurs when tissue gets infected. This can occur from blockage of oil or sweat glands, infection of hair follicles, or a minor injury to the skin. As the body tries to fight the infection, pus collects in the area and creates pressure under the skin. This pressure causes pain. People with weakened immune systems have difficulty fighting infections and get certain abscesses more often.  SYMPTOMS Usually an abscess develops on the skin and becomes a painful mass that is red, warm, and tender. If the abscess forms under the skin, you may feel a moveable soft area under the skin. Some abscesses break open (rupture) on their own, but most will continue to get worse without care. The infection can spread deeper into the body and eventually into the bloodstream, causing you to feel ill.  DIAGNOSIS  Your caregiver will take your medical history and perform a physical exam. A sample of fluid may also be taken from the abscess to determine what is causing your infection. TREATMENT  Your caregiver may prescribe antibiotic medicines to fight the infection. However, taking antibiotics alone usually does not cure an abscess. Your caregiver may need to make a small cut (incision) in the abscess to drain the pus. In some cases, gauze is packed into the abscess to reduce pain and to continue draining the area. HOME CARE INSTRUCTIONS   Only take over-the-counter or prescription medicines for pain, discomfort, or fever as directed by your caregiver.  If you were prescribed antibiotics, take them as directed. Finish them even if you start to feel better.  If gauze is used, follow your caregiver's directions for changing the gauze.  To avoid spreading the infection:  Keep your draining abscess covered with a  bandage.  Wash your hands well.  Do not share personal care items, towels, or whirlpools with others.  Avoid skin contact with others.  Keep your skin and clothes clean around the abscess.  Keep all follow-up appointments as directed by your caregiver. SEEK MEDICAL CARE IF:   You have increased pain, swelling, redness, fluid drainage, or bleeding.  You have muscle aches, chills, or a general ill feeling.  You have a fever. MAKE SURE YOU:   Understand these instructions.  Will watch your condition.  Will get help right away if you are not doing well or get worse. Document Released: 04/01/2005 Document Revised: 12/22/2011 Document Reviewed: 09/04/2011 [facility] Patient Information 2015 [place], [place]. This information is not intended to replace advice given to you by your health care provider. Make sure you discuss any questions you have with your health care provider.  Doxycycline tablets or capsules What is this medicine? DOXYCYCLINE (dox i SYE kleen) is a tetracycline antibiotic. It kills certain bacteria or stops their growth. It is used to treat many kinds of infections, like dental, skin, respiratory, and urinary tract infections. It also treats acne, Lyme disease, malaria, and certain sexually transmitted infections. This medicine may be used for other purposes; ask your health care provider or pharmacist if you have questions. COMMON BRAND NAME(S): Acticlate, Adoxa, Adoxa CK, Adoxa Pak, Adoxa TT, Alodox, Avidoxy, Doxal, Monodox, Morgidox 1x, Morgidox 1x Kit, Morgidox 2x, Morgidox 2x Kit, Ocudox, Vibra-Tabs, Vibramycin What should I tell my health care provider before I take this medicine? They need to know if  you have any of these conditions: -liver disease -long exposure to sunlight like working outdoors -stomach problems like colitis -an unusual or allergic reaction to doxycycline, tetracycline antibiotics, other medicines, foods, dyes, or preservatives -pregnant or  trying to get pregnant -breast-feeding How should I use this medicine? Take this medicine by mouth with a full glass of water. Follow the directions on the prescription label. It is best to take this medicine without food, but if it upsets your stomach take it with food. Take your medicine at regular intervals. Do not take your medicine more often than directed. Take all of your medicine as directed even if you think you are better. Do not skip doses or stop your medicine early. Talk to your pediatrician regarding the use of this medicine in children. Special care may be needed. While this drug may be prescribed for children as young as 8 years old for selected conditions, precautions do apply. Overdosage: If you think you have taken too much of this medicine contact a poison control center or emergency room at once. NOTE: This medicine is only for you. Do not share this medicine with others. What if I miss a dose? If you miss a dose, take it as soon as you can. If it is almost time for your next dose, take only that dose. Do not take double or extra doses. What may interact with this medicine? -antacids -barbiturates -birth control pills -bismuth subsalicylate -carbamazepine -methoxyflurane -other antibiotics -phenytoin -vitamins that contain iron -warfarin This list may not describe all possible interactions. Give your health care provider a list of all the medicines, herbs, non-prescription drugs, or dietary supplements you use. Also tell them if you smoke, drink alcohol, or use illegal drugs. Some items may interact with your medicine. What should I watch for while using this medicine? Tell your doctor or health care professional if your symptoms do not improve. Do not treat diarrhea with over the counter products. Contact your doctor if you have diarrhea that lasts more than 2 days or if it is severe and watery. Do not take this medicine just before going to bed. It may not dissolve  properly when you lay down and can cause pain in your throat. Drink plenty of fluids while taking this medicine to also help reduce irritation in your throat. This medicine can make you more sensitive to the sun. Keep out of the sun. If you cannot avoid being in the sun, wear protective clothing and use sunscreen. Do not use sun lamps or tanning beds/booths. Birth control pills may not work properly while you are taking this medicine. Talk to your doctor about using an extra method of birth control. If you are being treated for a sexually transmitted infection, avoid sexual contact until you have finished your treatment. Your sexual partner may also need treatment. Avoid antacids, aluminum, calcium, magnesium, and iron products for 4 hours before and 2 hours after taking a dose of this medicine. If you are using this medicine to prevent malaria, you should still protect yourself from contact with mosquitos. Stay in screened-in areas, use mosquito nets, keep your body covered, and use an insect repellent. What side effects may I notice from receiving this medicine? Side effects that you should report to your doctor or health care professional as soon as possible: -allergic reactions like skin rash, itching or hives, swelling of the face, lips, or tongue -difficulty breathing -fever -itching in the rectal or genital area -pain on swallowing -redness, blistering, peeling or  loosening of the skin, including inside the mouth °-severe stomach pain or cramps °-unusual bleeding or bruising °-unusually weak or tired °-yellowing of the eyes or skin °Side effects that usually do not require medical attention (report to your doctor or health care professional if they continue or are bothersome): °-diarrhea °-loss of appetite °-nausea, vomiting °This list may not describe all possible side effects. Call your doctor for medical advice about side effects. You may report side effects to FDA at 1-800-FDA-1088. °Where  should I keep my medicine? °Keep out of the reach of children. °Store at room temperature, below 30 degrees C (86 degrees F). Protect from light. Keep container tightly closed. Throw away any unused medicine after the expiration date. Taking this medicine after the expiration date can make you seriously ill. °NOTE: This sheet is a summary. It may not cover all possible information. If you have questions about this medicine, talk to your doctor, pharmacist, or health care provider. °© 2015, [employer]. (2013-04-28 13:58:06) ° ° °

## 2014-12-31 ENCOUNTER — Emergency Department (HOSPITAL_COMMUNITY)
Admission: EM | Admit: 2014-12-31 | Discharge: 2014-12-31 | Discharge status: Against Medical Advice | Payer: BLUE CROSS/BLUE SHIELD | Attending: Emergency Medicine | Admitting: Emergency Medicine

## 2014-12-31 ENCOUNTER — Emergency Department (HOSPITAL_COMMUNITY)
Admission: EM | Admit: 2014-12-31 | Discharge: 2014-12-31 | Disposition: A | Discharge status: Home / Self Care | Payer: BLUE CROSS/BLUE SHIELD | Attending: Emergency Medicine | Admitting: Emergency Medicine

## 2014-12-31 ENCOUNTER — Encounter (HOSPITAL_COMMUNITY): Payer: Self-pay | Admitting: *Deleted

## 2014-12-31 DIAGNOSIS — L089 Local infection of the skin and subcutaneous tissue, unspecified: Secondary | ICD-10-CM | POA: Diagnosis not present

## 2014-12-31 DIAGNOSIS — Z720 Tobacco use: Secondary | ICD-10-CM | POA: Insufficient documentation

## 2014-12-31 DIAGNOSIS — N492 Inflammatory disorders of scrotum: Secondary | ICD-10-CM | POA: Insufficient documentation

## 2014-12-31 DIAGNOSIS — F419 Anxiety disorder, unspecified: Secondary | ICD-10-CM | POA: Diagnosis not present

## 2014-12-31 DIAGNOSIS — L0291 Cutaneous abscess, unspecified: Secondary | ICD-10-CM

## 2014-12-31 DIAGNOSIS — Z79899 Other long term (current) drug therapy: Secondary | ICD-10-CM | POA: Diagnosis not present

## 2014-12-31 DIAGNOSIS — R1030 Lower abdominal pain, unspecified: Secondary | ICD-10-CM | POA: Diagnosis present

## 2014-12-31 DIAGNOSIS — M109 Gout, unspecified: Secondary | ICD-10-CM | POA: Diagnosis not present

## 2014-12-31 DIAGNOSIS — I10 Essential (primary) hypertension: Secondary | ICD-10-CM | POA: Insufficient documentation

## 2014-12-31 MED ORDER — LIDOCAINE HCL (PF) 2 % IJ SOLN
10.0000 mL | Freq: Once | INTRAMUSCULAR | Status: DC
  Filled 2014-12-31: qty 10

## 2014-12-31 MED ORDER — HYDROCODONE-ACETAMINOPHEN 5-325 MG PO TABS: ORAL_TABLET | ORAL | Status: DC

## 2014-12-31 NOTE — ED Notes (Signed)
Tammy PA at bedside,  

## 2014-12-31 NOTE — ED Provider Notes (Signed)
CSN: [id number]     Arrival date & time 12/31/14  2107 History   First MD Initiated Contact with Patient 12/31/14 2145     Chief Complaint  Patient presents with  . Groin Pain     (Consider location/radiation/quality/duration/timing/severity/associated sxs/prior Treatment) HPI   Dave Rowland is a 44 y.o. male who presents to the Emergency Department complaining of worsening abscess to his left scrotum.  He was seen here one day prior and had I&D performed.  He states he felt improvement of his symptoms initially, but then developed increasing pain and feels that his scrotum is swollen.  He states that he has been taking doxycycline twice daily.  Taking OTC pain reliever without relief.  He denies fever, chills, abdominal pain, and difficulty urinating.     Past Medical History  Diagnosis Date  . Hypertension   . Gout   . Anxiety    Past Surgical History  Procedure Laterality Date  . Knee arthrocentesis    . Shoulder arthroscopy    . Appendectomy     Family History  Problem Relation Age of Onset  . Hypertension Mother   . Cancer Mother   . Heart failure Father   . Hypertension Other   . Diabetes Other   . Heart attack Other    History  Substance Use Topics  . Smoking status: Current Every Day Smoker -- 1.00 packs/day    Types: Cigarettes  . Smokeless tobacco: Never Used  . Alcohol Use: No    Review of Systems  Constitutional: Negative for fever and chills.  Gastrointestinal: Negative for nausea and vomiting.  Genitourinary: Positive for scrotal swelling. Negative for dysuria, hematuria, penile swelling, penile pain and testicular pain.  Musculoskeletal: Negative for joint swelling and arthralgias.  Skin: Positive for color change.       Abscess   Neurological: Negative for dizziness, weakness and numbness.  Hematological: Negative for adenopathy.  All other systems reviewed and are negative.     Allergies  Sulfa antibiotics and Sulfur  Home Medications    Prior to Admission medications   Medication Sig Start Date End Date Taking? Authorizing Provider  amLODipine-benazepril (LOTREL) 5-40 MG per capsule Take 1 capsule by mouth daily. 11/25/14  Yes Eber HongBrian Miller, MD  bisoprolol-hydrochlorothiazide [facility](ZIAC) 5-6.25 MG per tablet Take 1 tablet by mouth 2 (two) times daily. 11/25/14  Yes Eber HongBrian Miller, MD  bumetanide (BUMEX) 1 MG tablet Take 1 mg by mouth daily.   Yes Historical Provider, MD  cetirizine (ZYRTEC) 10 MG tablet Take 10 mg by mouth once.   Yes Historical Provider, MD  doxycycline (VIBRAMYCIN) 100 MG capsule Take 1 capsule (100 mg total) by mouth 2 (two) times daily. One po bid x 7 days 12/30/14  Yes Dione Boozeavid Glick, MD  DULoxetine (CYMBALTA) 60 MG capsule Take 60 mg by mouth daily.   Yes Historical Provider, MD  indomethacin (INDOCIN) 50 MG capsule Take 50 mg by mouth 2 (two) times daily as needed (gout flareups).   Yes Historical Provider, MD  HYDROcodone-homatropine (HYCODAN) 5-1.5 MG/5ML syrup Take 5 mLs by mouth every 6 (six) hours as needed. Patient not taking: Reported on 12/31/2014 12/09/14   Ivery QualeHobson Bryant, PA-C  predniSONE (DELTASONE) 10 MG tablet 5,4,3,2,1 - take with food Patient not taking: Reported on 12/31/2014 12/09/14   Ivery QualeHobson Bryant, PA-C   BP 124/56 mmHg  Pulse 67  Temp(Src)   Resp 20  Ht 6' (1.829 m)  Wt 285 lb (129.275 kg)  BMI 38.64 kg/m2  SpO2 98% Physical Exam  Constitutional: He is oriented to person, place, and time. He appears well-developed and well-nourished.  HENT:  Head: Normocephalic and atraumatic.  Cardiovascular: Normal rate and regular rhythm.   Pulmonary/Chest: Effort normal. No respiratory distress.  Abdominal: Soft. He exhibits no distension. There is no tenderness. There is no rebound.  Genitourinary:  Focal area of induration to the lower left scrotum with previous incision.  Slight purulent, bloody drainage.  No obvious edema, no erythema.  Musculoskeletal: Normal range of motion.  Neurological: He is  alert and oriented to person, place, and time. Coordination normal.  Skin: Skin is warm.  Nursing note and vitals reviewed.   ED Course  Procedures (including critical care time) Labs Review Labs Reviewed - No data to display  Imaging Review No results found.   EKG Interpretation None        INCISION AND DRAINAGE Performed by: Maxwell Caul. Consent: Verbal consent obtained. Risks and benefits: risks, benefits and alternatives were discussed Type: abscess  Body area: left scotum  Anesthesia: local infiltration  Incision was made with a #11 scalpel.  Extended previous incision   Local anesthetic: lidocaine 2 % w/o epinephrine  Anesthetic total: 3 ml  Complexity: complex Blunt dissection to break up loculations  Drainage: purulent  Drainage amount: mild  Packing material: 1/4 in iodoform gauze  Patient tolerance: Patient tolerated the procedure well with no immediate complications.    MDM   Final diagnoses:  Abscess    Pt currently taking doxycycline.  Vitals stable.  Non-toxic appearing.  Previous incision size extended to allow for increased drainage.  Pt agrees to cont doxy, warm soaks and packing removal in 2 days    Pauline Aus, PA-C 01/02/15 2124  Samuel Jester, DO 01/03/15 1453

## 2014-12-31 NOTE — ED Notes (Signed)
Tammy PA in prior to RN, see PA assessment for further,

## 2014-12-31 NOTE — ED Notes (Signed)
Seen Sunday AM for folliculitis on L testicle.  Started doxycycline.  States swelling is worse and more painful.

## 2014-12-31 NOTE — Discharge Instructions (Signed)
Abscess °An abscess (boil or furuncle) is an infected area on or under the skin. This area is filled with yellowish-white fluid (pus) and other material (debris). °HOME CARE  °· Only take medicines as told by your doctor. °· If you were given antibiotic medicine, take it as directed. Finish the medicine even if you start to feel better. °· If gauze is used, follow your doctor's directions for changing the gauze. °· To avoid spreading the infection: °¨ Keep your abscess covered with a bandage. °¨ Wash your hands well. °¨ Do not share personal care items, towels, or whirlpools with others. °¨ Avoid skin contact with others. °· Keep your skin and clothes clean around the abscess. °· Keep all doctor visits as told. °GET HELP RIGHT AWAY IF:  °· You have more pain, puffiness (swelling), or redness in the wound site. °· You have more fluid or blood coming from the wound site. °· You have muscle aches, chills, or you feel sick. °· You have a fever. °MAKE SURE YOU:  °· Understand these instructions. °· Will watch your condition. °· Will get help right away if you are not doing well or get worse. °Document Released: 12/09/2007 Document Revised: 12/22/2011 Document Reviewed: 09/04/2011 °[employer]® Patient Information ©2015 [employer]. This information is not intended to replace advice given to you by your health care provider. Make sure you discuss any questions you have with your health care provider. ° °

## 2014-12-31 NOTE — ED Notes (Signed)
Patient seen Sunday for folliculitis on L testicle. States it has gotten more painful and swollen.  Was here earlier today, but LWBS d/t the wait time.

## 2015-01-17 MED FILL — Hydrocodone-Acetaminophen Tab 5-325 MG: ORAL | Qty: 6 | Status: AC

## 2015-01-21 ENCOUNTER — Emergency Department (HOSPITAL_COMMUNITY)
Admission: EM | Admit: 2015-01-21 | Discharge: 2015-01-21 | Disposition: A | Discharge status: Home / Self Care | Payer: BLUE CROSS/BLUE SHIELD | Attending: Emergency Medicine | Admitting: Emergency Medicine

## 2015-01-21 ENCOUNTER — Encounter (HOSPITAL_COMMUNITY): Payer: Self-pay | Admitting: *Deleted

## 2015-01-21 DIAGNOSIS — Z760 Encounter for issue of repeat prescription: Secondary | ICD-10-CM | POA: Insufficient documentation

## 2015-01-21 DIAGNOSIS — Z79899 Other long term (current) drug therapy: Secondary | ICD-10-CM | POA: Insufficient documentation

## 2015-01-21 DIAGNOSIS — F419 Anxiety disorder, unspecified: Secondary | ICD-10-CM | POA: Insufficient documentation

## 2015-01-21 DIAGNOSIS — I10 Essential (primary) hypertension: Secondary | ICD-10-CM | POA: Insufficient documentation

## 2015-01-21 DIAGNOSIS — M109 Gout, unspecified: Secondary | ICD-10-CM | POA: Diagnosis not present

## 2015-01-21 MED ORDER — AMLODIPINE BESY-BENAZEPRIL HCL 5-40 MG PO CAPS: 1.0000 | ORAL_CAPSULE | Freq: Every day | ORAL | Status: DC

## 2015-01-21 MED ORDER — BISOPROLOL-HYDROCHLOROTHIAZIDE 5-6.25 MG PO TABS: 1.0000 | ORAL_TABLET | Freq: Two times a day (BID) | ORAL | Status: DC

## 2015-01-21 NOTE — Discharge Instructions (Signed)
Medication Refill, Emergency Department °We have refilled your medication today as a courtesy to you. It is best for your medical care, however, to take care of getting refills done through your primary caregiver's office. They have your records and can do a better job of follow-up than we can in the emergency department. °On maintenance medications, we often only prescribe enough medications to get you by until you are able to see your regular caregiver. This is a more expensive way to refill medications. °In the future, please plan for refills so that you will not have to use the emergency department for this. °Thank you for your help. Your help allows us to better take care of the daily emergencies that enter our department. °Document Released: 10/09/2003 Document Revised: 09/14/2011 Document Reviewed: 09/29/2013 °[employer]® Patient Information ©2015 [employer]. This information is not intended to replace advice given to you by your health care provider. Make sure you discuss any questions you have with your health care provider. ° °

## 2015-01-21 NOTE — ED Notes (Signed)
Patient here for med refill on BP meds. 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

## 2015-01-21 NOTE — ED Provider Notes (Signed)
CSN: [id number]     Arrival date & time 01/21/15  1116 History  This chart was scribed for non-physician practitioner, Pauline Aus, PA-C working with Vanetta Mulders, MD found by Gwenyth Ober, ED scribe. This patient was seen in room APFT23/APFT23 and the patient's care was started at 12:03 PM   Chief Complaint  Patient presents with  . Medication Refill   The history is provided by the patient. No language interpreter was used.    HPI Comments: Dave Rowland is a 44 y.o. male with a history of HTN who presents to the Emergency Department for a refill of his blood pressure medication. 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 He has been without his medication for 2 days. Pt denies CP, SOB dizziness, and HA as associated symptoms.  States he has not established a PMD due to his work schedule.     Past Medical History  Diagnosis Date  . Hypertension   . Gout   . Anxiety    Past Surgical History  Procedure Laterality Date  . Knee arthrocentesis    . Shoulder arthroscopy    . Appendectomy     Family History  Problem Relation Age of Onset  . Hypertension Mother   . Cancer Mother   . Heart failure Father   . Hypertension Other   . Diabetes Other   . Heart attack Other    History  Substance Use Topics  . Smoking status: Current Every Day Smoker -- 1.00 packs/day    Types: Cigarettes  . Smokeless tobacco: Never Used  . Alcohol Use: No    Review of Systems  Respiratory: Negative for shortness of breath.   Cardiovascular: Negative for chest pain.  Neurological: Negative for headaches.  All other systems reviewed and are negative.   Allergies  Sulfa antibiotics and Sulfur  Home Medications   Prior to Admission medications   Medication Sig Start Date End Date Taking? Authorizing Provider  amLODipine-benazepril (LOTREL) 5-40 MG per capsule Take 1 capsule by mouth daily. 11/25/14   Eber Hong, MD  bisoprolol-hydrochlorothiazide  [facility]) 5-6.25 MG per tablet Take 1 tablet by mouth 2 (two) times daily. 11/25/14   Eber Hong, MD  bumetanide (BUMEX) 1 MG tablet Take 1 mg by mouth daily.    Historical Provider, MD  cetirizine (ZYRTEC) 10 MG tablet Take 10 mg by mouth once.    Historical Provider, MD  doxycycline (VIBRAMYCIN) 100 MG capsule Take 1 capsule (100 mg total) by mouth 2 (two) times daily. One po bid x 7 days 12/30/14   Dione Booze, MD  DULoxetine (CYMBALTA) 60 MG capsule Take 60 mg by mouth daily.    Historical Provider, MD  HYDROcodone-acetaminophen (NORCO/VICODIN) 5-325 MG per tablet Take one-two tabs po q 4-6 hrs prn pain 12/31/14   Ameya Kutz, PA-C  HYDROcodone-acetaminophen (NORCO/VICODIN) 5-325 MG per tablet Take one-two tabs po q 4-6 hrs prn pain 12/31/14   Amery Minasyan, PA-C  HYDROcodone-homatropine (HYCODAN) 5-1.5 MG/5ML syrup Take 5 mLs by mouth every 6 (six) hours as needed. Patient not taking: Reported on 12/31/2014 12/09/14   Ivery Quale, PA-C  indomethacin (INDOCIN) 50 MG capsule Take 50 mg by mouth 2 (two) times daily as needed (gout flareups).    Historical Provider, MD  predniSONE (DELTASONE) 10 MG tablet 5,4,3,2,1 - take with food Patient not taking: Reported on 12/31/2014 12/09/14   Ivery Quale, PA-C   BP 182/101 mmHg  Pulse 75  Temp(Src) 97.8 F (36.6 C) (Oral)  Resp 18  Ht 6' (1.829 m)  Wt 285 lb (129.275 kg)  BMI 38.64 kg/m2  SpO2 98% Physical Exam  Constitutional: He is oriented to person, place, and time. He appears well-developed and well-nourished. No distress.  HENT:  Head: Normocephalic and atraumatic.  Mouth/Throat: Oropharynx is clear and moist.  Eyes: Conjunctivae and EOM are normal. Pupils are equal, round, and reactive to light.  Neck: Normal range of motion. Neck supple. No tracheal deviation present.  Cardiovascular: Normal rate, regular rhythm, normal heart sounds and intact distal pulses.   No murmur heard. Pulmonary/Chest: Effort normal and breath sounds normal. No  respiratory distress.  Musculoskeletal: Normal range of motion.  Lymphadenopathy:    He has no cervical adenopathy.  Neurological: He is alert and oriented to person, place, and time. He exhibits normal muscle tone. Coordination normal.  Skin: Skin is warm and dry.  Psychiatric: He has a normal mood and affect. His behavior is normal.  Nursing note and vitals reviewed.   ED Course  Procedures   DIAGNOSTIC STUDIES: Oxygen Saturation is 98% on RA, normal by my interpretation.    COORDINATION OF CARE: 12:11 PM Provided pt resource guide for family medicine in the area. Discussed treatment plan with pt which includes refill prescription. Pt agreed to plan.   Labs Review Labs Reviewed - No data to display  Imaging Review No results found.   EKG Interpretation None      MDM  Pt is hypertensive, but has not had medication in 4 days. Pt agrees to get refills today.  Final diagnoses:  Medication refill    Pt is well appearing.  Given local clinic info.  Agrees to establish primary care.  Vitals improved at time of d/c.  Appears stable for d/c  I personally performed the services described in this documentation, which was scribed in my presence. The recorded information has been reviewed and is accurate.'   Pauline Ausammy Deandrea Vanpelt, PA-C 01/21/15 2056  Vanetta MuldersScott Zackowski, MD 01/23/15 [phone]

## 2015-02-25 ENCOUNTER — Emergency Department (HOSPITAL_COMMUNITY): Payer: BLUE CROSS/BLUE SHIELD

## 2015-02-25 ENCOUNTER — Emergency Department (HOSPITAL_COMMUNITY)
Admission: EM | Admit: 2015-02-25 | Discharge: 2015-02-25 | Disposition: A | Discharge status: Home / Self Care | Payer: BLUE CROSS/BLUE SHIELD | Attending: Emergency Medicine | Admitting: Emergency Medicine

## 2015-02-25 ENCOUNTER — Encounter (HOSPITAL_COMMUNITY): Payer: Self-pay | Admitting: *Deleted

## 2015-02-25 DIAGNOSIS — W1839XA Other fall on same level, initial encounter: Secondary | ICD-10-CM | POA: Diagnosis not present

## 2015-02-25 DIAGNOSIS — Y9289 Other specified places as the place of occurrence of the external cause: Secondary | ICD-10-CM | POA: Insufficient documentation

## 2015-02-25 DIAGNOSIS — M79604 Pain in right leg: Secondary | ICD-10-CM

## 2015-02-25 DIAGNOSIS — M545 Low back pain: Secondary | ICD-10-CM

## 2015-02-25 DIAGNOSIS — S3992XA Unspecified injury of lower back, initial encounter: Secondary | ICD-10-CM | POA: Diagnosis present

## 2015-02-25 DIAGNOSIS — Z79899 Other long term (current) drug therapy: Secondary | ICD-10-CM | POA: Diagnosis not present

## 2015-02-25 DIAGNOSIS — Y998 Other external cause status: Secondary | ICD-10-CM | POA: Diagnosis not present

## 2015-02-25 DIAGNOSIS — F419 Anxiety disorder, unspecified: Secondary | ICD-10-CM | POA: Diagnosis not present

## 2015-02-25 DIAGNOSIS — Z720 Tobacco use: Secondary | ICD-10-CM | POA: Diagnosis not present

## 2015-02-25 DIAGNOSIS — Y9389 Activity, other specified: Secondary | ICD-10-CM | POA: Diagnosis not present

## 2015-02-25 DIAGNOSIS — W19XXXA Unspecified fall, initial encounter: Secondary | ICD-10-CM

## 2015-02-25 DIAGNOSIS — I10 Essential (primary) hypertension: Secondary | ICD-10-CM | POA: Insufficient documentation

## 2015-02-25 DIAGNOSIS — Z8739 Personal history of other diseases of the musculoskeletal system and connective tissue: Secondary | ICD-10-CM | POA: Diagnosis not present

## 2015-02-25 MED ORDER — CYCLOBENZAPRINE HCL 5 MG PO TABS
5.0000 mg | ORAL_TABLET | Freq: Three times a day (TID) | ORAL | Status: DC | PRN
Start: 2015-02-25 — End: 2015-11-14

## 2015-02-25 MED ORDER — NAPROXEN 500 MG PO TABS: 500.0000 mg | ORAL_TABLET | Freq: Two times a day (BID) | ORAL | Status: DC

## 2015-02-25 MED ORDER — HYDROCODONE-ACETAMINOPHEN 5-325 MG PO TABS: 1.0000 | ORAL_TABLET | ORAL | Status: DC | PRN

## 2015-02-25 NOTE — Discharge Instructions (Signed)
Lumbosacral Strain Lumbosacral strain is a strain of any of the parts that make up your lumbosacral vertebrae. Your lumbosacral vertebrae are the bones that make up the lower third of your backbone. Your lumbosacral vertebrae are held together by muscles and tough, fibrous tissue (ligaments).  CAUSES  A sudden blow to your back can cause lumbosacral strain. Also, anything that causes an excessive stretch of the muscles in the low back can cause this strain. This is typically seen when people exert themselves strenuously, fall, lift heavy objects, bend, or crouch repeatedly. RISK FACTORS  Physically demanding work.  Participation in pushing or pulling sports or sports that require a sudden twist of the back (tennis, golf, baseball).  Weight lifting.  Excessive lower back curvature.  Forward-tilted pelvis.  Weak back or abdominal muscles or both.  Tight hamstrings. SIGNS AND SYMPTOMS  Lumbosacral strain may cause pain in the area of your injury or pain that moves (radiates) down your leg.  DIAGNOSIS Your health care provider can often diagnose lumbosacral strain through a physical exam. In some cases, you may need tests such as X-ray exams.  TREATMENT  Treatment for your lower back injury depends on many factors that your clinician will have to evaluate. However, most treatment will include the use of anti-inflammatory medicines. HOME CARE INSTRUCTIONS   Avoid hard physical activities (tennis, racquetball, waterskiing) if you are not in proper physical condition for it. This may aggravate or create problems.  If you have a back problem, avoid sports requiring sudden body movements. Swimming and walking are generally safer activities.  Maintain good posture.  Maintain a healthy weight.  For acute conditions, you may put ice on the injured area.  Put ice in a plastic bag.  Place a towel between your skin and the bag.  Leave the ice on for 20 minutes, 2-3 times a day.  When the  low back starts healing, stretching and strengthening exercises may be recommended. SEEK MEDICAL CARE IF:  Your back pain is getting worse.  You experience severe back pain not relieved with medicines. SEEK IMMEDIATE MEDICAL CARE IF:   You have numbness, tingling, weakness, or problems with the use of your arms or legs.  There is a change in bowel or bladder control.  You have increasing pain in any area of the body, including your belly (abdomen).  You notice shortness of breath, dizziness, or feel faint.  You feel sick to your stomach (nauseous), are throwing up (vomiting), or become sweaty.  You notice discoloration of your toes or legs, or your feet get very cold. MAKE SURE YOU:   Understand these instructions.  Will watch your condition.  Will get help right away if you are not doing well or get worse. Document Released: 04/01/2005 Document Revised: 06/27/2013 Document Reviewed: 02/08/2013 [facility] Patient Information 2015 [place], [place]. This information is not intended to replace advice given to you by your health care provider. Make sure you discuss any questions you have with your health care provider.  Radicular Pain Radicular pain in either the arm or leg is usually from a bulging or herniated disk in the spine. A piece of the herniated disk may press against the nerves as the nerves exit the spine. This causes pain which is felt at the tips of the nerves down the arm or leg. Other causes of radicular pain may include:  Fractures.  Heart disease.  Cancer.  An abnormal and usually degenerative state of the nervous system or nerves (neuropathy). Diagnosis may  require CT or MRI scanning to determine the primary cause.  Nerves that start at the neck (nerve roots) may cause radicular pain in the outer shoulder and arm. It can spread down to the thumb and fingers. The symptoms vary depending on which nerve root has been affected. In most cases radicular pain improves  with conservative treatment. Neck problems may require physical therapy, a neck collar, or cervical traction. Treatment may take many weeks, and surgery may be considered if the symptoms do not improve.  Conservative treatment is also recommended for sciatica. Sciatica causes pain to radiate from the lower back or buttock area down the leg into the foot. Often there is a history of back problems. Most patients with sciatica are better after 2 to 4 weeks of rest and other supportive care. Short term bed rest can reduce the disk pressure considerably. Sitting, however, is not a good position since this increases the pressure on the disk. You should avoid bending, lifting, and all other activities which make the problem worse. Traction can be used in severe cases. Surgery is usually reserved for patients who do not improve within the first months of treatment. Only take over-the-counter or prescription medicines for pain, discomfort, or fever as directed by your caregiver. Narcotics and muscle relaxants may help by relieving more severe pain and spasm and by providing mild sedation. Cold or massage can give significant relief. Spinal manipulation is not recommended. It can increase the degree of disc protrusion. Epidural steroid injections are often effective treatment for radicular pain. These injections deliver medicine to the spinal nerve in the space between the protective covering of the spinal cord and back bones (vertebrae). Your caregiver can give you more information about steroid injections. These injections are most effective when given within two weeks of the onset of pain.  You should see your caregiver for follow up care as recommended. A program for neck and back injury rehabilitation with stretching and strengthening exercises is an important part of management.  SEEK IMMEDIATE MEDICAL CARE IF:  You develop increased pain, weakness, or numbness in your arm or leg.  You develop difficulty with  bladder or bowel control.  You develop abdominal pain. Document Released: 07/30/2004 Document Revised: 09/14/2011 Document Reviewed: 10/15/2008 [facility] Patient Information 2015 [place], [place]. This information is not intended to replace advice given to you by your health care provider. Make sure you discuss any questions you have with your health care provider.    Use the the other medicines as directed.  Do not drive within 4 hours of taking hydrocodone or flexeril as this will make you drowsy.  Avoid lifting,  Bending,  Twisting or any other activity that worsens your pain over the next week.  Apply an  icepack  to your lower back for 10-15 minutes every 2 hours for the next 2 days.  You should get rechecked if your symptoms are not better over the next 5 days,  Or you develop increased pain,  Weakness in your leg(s) or loss of bladder or bowel function - these are symptoms of a worse injury.

## 2015-02-25 NOTE — ED Notes (Signed)
Patient reports fall this morning, c/o low back pain that radiates to right leg. Denies hitting head.

## 2015-02-25 NOTE — ED Provider Notes (Signed)
History  This chart was scribed for non-physician practitioner, Burgess Amor, PA-C,working with Raeford Razor, MD, by Karle Plumber, ED Scribe. This patient was seen in room APFT22/APFT22 and the patient's care was started at 4:15 PM.  Chief Complaint  Patient presents with  . Back Pain   The history is provided by the patient and medical records. No language interpreter was used.    HPI Comments:  Dave Rowland is a 44 y.o. obese male who presents to the Emergency Department complaining of severe lower back pain that began approximately four hours ago after falling. He reports he has been experiencing some numbness/paresthesias down his posterior upper and lateral lower right leg down to his toes since the fall. He states he slipped on a puddle of water in his hallway before discovering his hot water heater was leaking and landed on his buttocks. He reports taking Ibuprofen 800 mg for pain after the fall with no significant relief of the pain. Standing and walking make the pain worse. He denies alleviating factors. He denies head trauma, LOC, bowel or bladder incontinence, nausea, vomiting, bruising or wounds. 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 He does not have a PCP.  Past Medical History  Diagnosis Date  . Hypertension   . Gout   . Anxiety    Past Surgical History  Procedure Laterality Date  . Knee arthrocentesis    . Shoulder arthroscopy    . Appendectomy     Family History  Problem Relation Age of Onset  . Hypertension Mother   . Cancer Mother   . Heart failure Father   . Hypertension Other   . Diabetes Other   . Heart attack Other    Social History  Substance Use Topics  . Smoking status: Current Every Day Smoker -- 1.00 packs/day    Types: Cigarettes  . Smokeless tobacco: Never Used  . Alcohol Use: No    Review of Systems  Constitutional: Negative for fever.  Respiratory: Negative for shortness of breath.   Cardiovascular: Negative for  chest pain and leg swelling.  Gastrointestinal: Negative for nausea, vomiting, abdominal pain, constipation and abdominal distention.  Genitourinary: Negative for dysuria, urgency, frequency, flank pain and difficulty urinating.       No bowel or bladder incontinence  Musculoskeletal: Positive for back pain. Negative for joint swelling and gait problem.  Skin: Negative for color change, rash and wound.  Neurological: Positive for numbness (paresthesia). Negative for syncope and weakness.    Allergies  Sulfa antibiotics and Sulfur  Home Medications   Prior to Admission medications   Medication Sig Start Date End Date Taking? Authorizing Provider  amLODipine-benazepril (LOTREL) 5-40 MG per capsule Take 1 capsule by mouth daily. 01/21/15  Yes Tammy Triplett, PA-C  bisoprolol-hydrochlorothiazide (ZIAC) 5-6.25 MG per tablet Take 1 tablet by mouth 2 (two) times daily. 01/21/15  Yes Tammy Triplett, PA-C  bumetanide (BUMEX) 1 MG tablet Take 1 mg by mouth daily.   Yes Historical Provider, MD  DULoxetine (CYMBALTA) 60 MG capsule Take 60 mg by mouth daily.   Yes Historical Provider, MD  indomethacin (INDOCIN) 50 MG capsule Take 50 mg by mouth 2 (two) times daily as needed (gout flareups).   Yes Historical Provider, MD  cyclobenzaprine (FLEXERIL) 5 MG tablet Take 1 tablet (5 mg total) by mouth 3 (three) times daily as needed for muscle spasms. 02/25/15   Burgess Amor, PA-C  doxycycline (VIBRAMYCIN) 100 MG capsule Take 1 capsule (100 mg total)  by mouth 2 (two) times daily. One po bid x 7 days Patient not taking: Reported on 02/25/2015 12/30/14   Dione Booze, MD  HYDROcodone-acetaminophen (NORCO/VICODIN) 5-325 MG per tablet Take 1 tablet by mouth every 4 (four) hours as needed. 02/25/15   Burgess Amor, PA-C  naproxen (NAPROSYN) 500 MG tablet Take 1 tablet (500 mg total) by mouth 2 (two) times daily. 02/25/15   Burgess Amor, PA-C   Triage Vitals: BP 151/81 mmHg  Pulse 62  Temp(Src) 97.4 F (36.3 C)  Resp 18  Ht  6\' 3"  (1.905 m)  Wt 285 lb (129.275 kg)  BMI 35.62 kg/m2  SpO2 99% Physical Exam  Constitutional: He appears well-developed and well-nourished.  HENT:  Head: Normocephalic.  Eyes: Conjunctivae are normal.  Neck: Normal range of motion. Neck supple.  Cardiovascular: Normal rate and intact distal pulses.   Pedal pulses normal.  Pulmonary/Chest: Effort normal.  Abdominal: Soft. Bowel sounds are normal. He exhibits no distension and no mass.  Musculoskeletal: Normal range of motion. He exhibits no edema.       Lumbar back: He exhibits tenderness. He exhibits no swelling, no edema and no spasm.  Neurological: He is alert. He has normal strength. He displays no atrophy and no tremor. No sensory deficit. Gait normal. He displays no Babinski's sign on the right side. He displays no Babinski's sign on the left side.  Reflex Scores:      Patellar reflexes are 2+ on the right side and 2+ on the left side.      Achilles reflexes are 2+ on the right side and 2+ on the left side. No strength deficit noted in hip and knee flexor and extensor muscle groups.  Ankle flexion and extension intact. Reports decreased sensation along lateral calf and posterior thigh of right leg.  Ambulatory without foot drop.  Skin: Skin is warm and dry.  Psychiatric: He has a normal mood and affect.  Nursing note and vitals reviewed.   ED Course  Procedures (including critical care time) DIAGNOSTIC STUDIES: Oxygen Saturation is 99% on RA, normal by my interpretation.   COORDINATION OF CARE: 4:26 PM- Will provide work note for patient. Will X-Ray lumbar spine. Will prescribe pain medication and muscle relaxer. Advised pt to continue taking OTC NSAID and apply ice therapy as tolerated. Pt verbalizes understanding and agrees to plan.  PRN f/u if sx persist. Referrals given.  Medications - No data to display  Labs Review Labs Reviewed - No data to display  Imaging Review Dg Lumbar Spine Complete  02/25/2015    CLINICAL DATA:  Fall, low back pain, right leg pain  EXAM: LUMBAR SPINE - COMPLETE 4+ VIEW  COMPARISON:  03/18/2010  FINDINGS: Five views of lumbar spine submitted. No acute fracture or subluxation. Again noted left nephrolithiasis. There is mild disc space flattening at L4-L5 and L5-S1 level.  IMPRESSION: No acute fracture or subluxation. Mild disc space flattening at L4-L5 and L5-S1 level.   Electronically Signed   By: Natasha Mead M.D.   On: 02/25/2015 16:51   I have personally reviewed and evaluated these images and lab results as part of my medical decision-making.   EKG Interpretation None      MDM   Final diagnoses:  Lumbar pain with radiation down right leg  Fall, initial encounter    No neuro deficit on exam or by history to suggest emergent or surgical presentation.  Also discussed worsened sx that should prompt immediate re-evaluation including distal weakness, bowel/bladder retention/incontinence.  I personally performed the services described in this documentation, which was scribed in my presence. The recorded information has been reviewed and is accurate.    Burgess Amor, PA-C 02/27/15 1021  Raeford Razor, MD 02/27/15 [phone]

## 2015-03-30 ENCOUNTER — Encounter (HOSPITAL_COMMUNITY): Payer: Self-pay | Admitting: *Deleted

## 2015-03-30 ENCOUNTER — Emergency Department (HOSPITAL_COMMUNITY)
Admission: EM | Admit: 2015-03-30 | Discharge: 2015-03-30 | Disposition: A | Discharge status: Home / Self Care | Payer: BLUE CROSS/BLUE SHIELD | Attending: Emergency Medicine | Admitting: Emergency Medicine

## 2015-03-30 DIAGNOSIS — Z79899 Other long term (current) drug therapy: Secondary | ICD-10-CM | POA: Insufficient documentation

## 2015-03-30 DIAGNOSIS — R51 Headache: Secondary | ICD-10-CM | POA: Insufficient documentation

## 2015-03-30 DIAGNOSIS — I10 Essential (primary) hypertension: Secondary | ICD-10-CM | POA: Insufficient documentation

## 2015-03-30 DIAGNOSIS — R110 Nausea: Secondary | ICD-10-CM | POA: Insufficient documentation

## 2015-03-30 DIAGNOSIS — Z720 Tobacco use: Secondary | ICD-10-CM | POA: Insufficient documentation

## 2015-03-30 DIAGNOSIS — M109 Gout, unspecified: Secondary | ICD-10-CM | POA: Insufficient documentation

## 2015-03-30 DIAGNOSIS — F419 Anxiety disorder, unspecified: Secondary | ICD-10-CM | POA: Insufficient documentation

## 2015-03-30 MED ORDER — AMLODIPINE BESYLATE 5 MG PO TABS: 5.0000 mg | ORAL_TABLET | Freq: Every day | ORAL | Status: DC

## 2015-03-30 MED ORDER — BENAZEPRIL HCL 40 MG PO TABS: 40.0000 mg | ORAL_TABLET | Freq: Two times a day (BID) | ORAL | Status: DC

## 2015-03-30 NOTE — ED Notes (Signed)
Pt states he ran out of BP meds 2 days ago and cannot see Dr. Gerda Diss for initial, new patient appt until Oct. 17.

## 2015-03-30 NOTE — ED Provider Notes (Signed)
CSN: [id number]     Arrival date & time 03/30/15  4098 History  This chart was scribed for Zadie Rhine, MD by Freida Busman, ED Scribe. This patient was seen in room APA01/APA01 and the patient's care was started 10:10 AM.    Chief Complaint  Patient presents with  . Medication Refill   The history is provided by the patient. No language interpreter was used.     HPI Comments:  Dave Rowland is a 44 y.o. male with a history of HTN, who presents to the Emergency Department for a medication refill. Pt states he ran out of BP meds (Amlodipine, Benazepril) 2 weeks ago. Pt has an appointment with his new PCP on Oct 17th, 2016. He reports HA with pain behind his left eye which started yesterday  and nausea. His pain at this time is a 5/10 which he states is improved since onset. He denies fever, CP, back pain, vomiting, and weakness to his extremities. No alleviating factors noted.  Past Medical History  Diagnosis Date  . Hypertension   . Gout   . Anxiety    Past Surgical History  Procedure Laterality Date  . Knee arthrocentesis    . Shoulder arthroscopy    . Appendectomy     Family History  Problem Relation Age of Onset  . Hypertension Mother   . Cancer Mother   . Heart failure Father   . Hypertension Other   . Diabetes Other   . Heart attack Other    Social History  Substance Use Topics  . Smoking status: Current Every Day Smoker -- 1.00 packs/day    Types: Cigarettes  . Smokeless tobacco: Never Used  . Alcohol Use: No    Review of Systems  Constitutional: Negative for fever.       + Med refill  Eyes: Negative for visual disturbance.  Gastrointestinal: Positive for nausea.  Neurological: Positive for headaches. Negative for weakness.    Allergies  Sulfa antibiotics and Sulfur  Home Medications   Prior to Admission medications   Medication Sig Start Date End Date Taking? Authorizing Provider  amLODipine (NORVASC) 5 MG tablet Take 1 tablet (5 mg total) by  mouth daily. 03/30/15   Zadie Rhine, MD  benazepril (LOTENSIN) 40 MG tablet Take 1 tablet (40 mg total) by mouth 2 (two) times daily. 03/30/15   Zadie Rhine, MD  bisoprolol-hydrochlorothiazide [facility]) 5-6.25 MG per tablet Take 1 tablet by mouth 2 (two) times daily. 01/21/15   Tammy Triplett, PA-C  bumetanide (BUMEX) 1 MG tablet Take 1 mg by mouth daily.    Historical Provider, MD  cyclobenzaprine (FLEXERIL) 5 MG tablet Take 1 tablet (5 mg total) by mouth 3 (three) times daily as needed for muscle spasms. 02/25/15   Burgess Amor, PA-C  DULoxetine (CYMBALTA) 60 MG capsule Take 60 mg by mouth daily.    Historical Provider, MD  indomethacin (INDOCIN) 50 MG capsule Take 50 mg by mouth 2 (two) times daily as needed (gout flareups).    Historical Provider, MD   BP 155/81 mmHg  Pulse 63  Resp 16  Ht 6' (1.829 m)  Wt 285 lb (129.275 kg)  BMI 38.64 kg/m2  SpO2 100% Physical Exam  Nursing note and vitals reviewed.  CONSTITUTIONAL: Well developed/well nourished HEAD: Normocephalic/atraumatic EYES: EOMI/PERRL ENMT: Mucous membranes moist NECK: supple no meningeal signs CV: S1/S2 noted, no murmurs/rubs/gallops noted LUNGS: Lungs are clear to auscultation bilaterally, no apparent distress ABDOMEN: soft, nontender NEURO: Pt is awake/alert/appropriate, moves all extremitiesx4.  No facial droop.   EXTREMITIES: pulses normal/equal, full ROM SKIN: warm, color normal PSYCH: no abnormalities of mood noted, alert and oriented to situation  ED Course  Procedures  DIAGNOSTIC STUDIES:  Oxygen Saturation is 100% on RA, normal by my interpretation.    COORDINATION OF CARE:  10:17 AM Will discharge with months supply of BP meds.  Discussed plan with pt at bedside and pt agreed to plan.  He reports he takes norvasc  daily and benazepril 40BID Advised PCP F/u  MDM   Final diagnoses:  Essential hypertension    Nursing notes including past medical history and social history reviewed and  considered in documentation   I personally performed the services described in this documentation, which was scribed in my presence. The recorded information has been reviewed and is accurate.      Zadie Rhine, MD 03/30/15 1023

## 2015-03-30 NOTE — Discharge Instructions (Signed)

## 2015-04-14 ENCOUNTER — Encounter (HOSPITAL_COMMUNITY): Payer: Self-pay

## 2015-04-14 ENCOUNTER — Emergency Department (HOSPITAL_COMMUNITY)
Admission: EM | Admit: 2015-04-14 | Discharge: 2015-04-14 | Disposition: A | Discharge status: Home / Self Care | Payer: BLUE CROSS/BLUE SHIELD | Attending: Emergency Medicine | Admitting: Emergency Medicine

## 2015-04-14 DIAGNOSIS — W228XXA Striking against or struck by other objects, initial encounter: Secondary | ICD-10-CM | POA: Insufficient documentation

## 2015-04-14 DIAGNOSIS — M109 Gout, unspecified: Secondary | ICD-10-CM

## 2015-04-14 DIAGNOSIS — Z79899 Other long term (current) drug therapy: Secondary | ICD-10-CM | POA: Insufficient documentation

## 2015-04-14 DIAGNOSIS — I10 Essential (primary) hypertension: Secondary | ICD-10-CM | POA: Insufficient documentation

## 2015-04-14 DIAGNOSIS — M10072 Idiopathic gout, left ankle and foot: Secondary | ICD-10-CM | POA: Insufficient documentation

## 2015-04-14 DIAGNOSIS — Y9389 Activity, other specified: Secondary | ICD-10-CM | POA: Insufficient documentation

## 2015-04-14 DIAGNOSIS — S40812A Abrasion of left upper arm, initial encounter: Secondary | ICD-10-CM | POA: Insufficient documentation

## 2015-04-14 DIAGNOSIS — Y9289 Other specified places as the place of occurrence of the external cause: Secondary | ICD-10-CM | POA: Insufficient documentation

## 2015-04-14 DIAGNOSIS — Z720 Tobacco use: Secondary | ICD-10-CM | POA: Insufficient documentation

## 2015-04-14 DIAGNOSIS — Y998 Other external cause status: Secondary | ICD-10-CM | POA: Insufficient documentation

## 2015-04-14 DIAGNOSIS — F419 Anxiety disorder, unspecified: Secondary | ICD-10-CM | POA: Insufficient documentation

## 2015-04-14 MED ORDER — INDOMETHACIN 50 MG PO CAPS: 50.0000 mg | ORAL_CAPSULE | Freq: Two times a day (BID) | ORAL | Status: DC

## 2015-04-14 MED ORDER — HYDROCODONE-ACETAMINOPHEN 5-325 MG PO TABS: 2.0000 | ORAL_TABLET | ORAL | Status: DC | PRN

## 2015-04-14 MED ORDER — INDOMETHACIN 25 MG PO CAPS
50.0000 mg | ORAL_CAPSULE | Freq: Once | ORAL | Status: AC
  Administered 2015-04-14: 50 mg via ORAL
  Filled 2015-04-14: qty 2

## 2015-04-14 MED ORDER — HYDROCODONE-ACETAMINOPHEN 5-325 MG PO TABS
1.0000 | ORAL_TABLET | ORAL | Status: DC | PRN
Start: 2015-04-14 — End: 2015-05-29

## 2015-04-14 MED ORDER — COLCHICINE 0.6 MG PO TABS
0.6000 mg | ORAL_TABLET | Freq: Once | ORAL | Status: DC
  Filled 2015-04-14: qty 1

## 2015-04-14 MED ORDER — COLCHICINE 0.6 MG PO TABS
1.2000 mg | ORAL_TABLET | Freq: Once | ORAL | Status: AC
  Administered 2015-04-14: 1.2 mg via ORAL
  Filled 2015-04-14: qty 2

## 2015-04-14 NOTE — Discharge Instructions (Signed)

## 2015-04-14 NOTE — ED Notes (Signed)
Patient states that he history of gout and that his left great toe is swollen, this caused him to fall up against his car door and something on the door stuck into his left arm.

## 2015-04-14 NOTE — ED Notes (Signed)
Pt verbalized understanding of no driving and to use caution within 4 hours of taking pain meds due to meds cause drowsiness 

## 2015-04-14 NOTE — ED Provider Notes (Signed)
CSN: [id number]     Arrival date & time 04/14/15  1845 History  By signing my name below, I, Lyndel Safe, attest that this documentation has been prepared under the direction and in the presence of Burgess Amor, PA-C. Electronically Signed: Lyndel Safe, ED Scribe. 04/14/2015. 7:58 PM.   Chief Complaint  Patient presents with  . Gout   The history is provided by the patient. No language interpreter was used.   HPI Comments: Dave Rowland is a 44 y.o. male, with a PMhx of HTN and gout, who presents to the Emergency Department complaining of an acute flare up of gout in left great toe worsening since onset 4 days ago. He reports he normally is prescribed indomethacin with resolution of gout in 2-3 days for his acute gout flare ups. He has been prescribed allopurinol by a past PCP over 6 months ago which did not seem to alleviate his gout flare ups. Pt reports his current episode of gout is worse than past experiences. Pain is exacerbated with ambulation and weight bearing. He is a long distance truck driver by trade and states he is unable to push down on the clutch with his left foot secondary to pain. Pt additionally fell while ambulating in the ED due to pain in left great toe and sustained a small abrasion to left arm after hitting the door knob. Tetanus UTD.   Past Medical History  Diagnosis Date  . Hypertension   . Gout   . Anxiety    Past Surgical History  Procedure Laterality Date  . Knee arthrocentesis    . Shoulder arthroscopy    . Appendectomy     Family History  Problem Relation Age of Onset  . Hypertension Mother   . Cancer Mother   . Heart failure Father   . Hypertension Other   . Diabetes Other   . Heart attack Other    Social History  Substance Use Topics  . Smoking status: Current Every Day Smoker -- 1.00 packs/day    Types: Cigarettes  . Smokeless tobacco: Never Used  . Alcohol Use: No    Review of Systems  Musculoskeletal: Positive for joint swelling  ( left great toe ) and arthralgias ( left great toe ).  Skin: Positive for color change ( left great toe erythema ) and wound ( small abrasion to left arm).   Allergies  Sulfa antibiotics and Sulfur  Home Medications   Prior to Admission medications   Medication Sig Start Date End Date Taking? Authorizing Provider  amLODipine (NORVASC) 5 MG tablet Take 1 tablet (5 mg total) by mouth daily. 03/30/15  Yes Zadie Rhine, MD  benazepril (LOTENSIN) 40 MG tablet Take 1 tablet (40 mg total) by mouth 2 (two) times daily. 03/30/15  Yes Zadie Rhine, MD  bumetanide (BUMEX) 1 MG tablet Take 1 mg by mouth every other day.    Yes Historical Provider, MD  DULoxetine (CYMBALTA) 60 MG capsule Take 60 mg by mouth daily.   Yes Historical Provider, MD  bisoprolol-hydrochlorothiazide (ZIAC) 5-6.25 MG per tablet Take 1 tablet by mouth 2 (two) times daily. 01/21/15   Tammy Triplett, PA-C  cyclobenzaprine (FLEXERIL) 5 MG tablet Take 1 tablet (5 mg total) by mouth 3 (three) times daily as needed for muscle spasms. Patient not taking: Reported on 04/14/2015 02/25/15   Burgess Amor, PA-C  HYDROcodone-acetaminophen (NORCO/VICODIN) 5-325 MG tablet Take 1 tablet by mouth every 4 (four) hours as needed. 04/14/15   Burgess Amor, PA-C  HYDROcodone-acetaminophen (  NORCO/VICODIN) 5-325 MG tablet Take 2 tablets by mouth every 4 (four) hours as needed. 04/14/15   Burgess Amor, PA-C  indomethacin (INDOCIN) 50 MG capsule Take 1 capsule (50 mg total) by mouth 2 (two) times daily with a meal. 04/14/15   Burgess Amor, PA-C   BP 165/91 mmHg  Pulse 86  Temp(Src) 98.3 F (36.8 C) (Oral)  Resp 18  Ht 6' (1.829 m)  Wt 285 lb (129.275 kg)  BMI 38.64 kg/m2  SpO2 99% Physical Exam  Constitutional: He appears well-developed and well-nourished.  HENT:  Head: Atraumatic.  Neck: Normal range of motion.  Cardiovascular:  Pulses:      Dorsalis pedis pulses are 2+ on the right side, and 2+ on the left side.  Musculoskeletal: He exhibits  tenderness.       Feet:  ttp left mtp of great toe, warm with mild erythema.  No red streaking. Skin intact.    Neurological: He is alert. He has normal strength. He displays normal reflexes. No sensory deficit.  Skin: Skin is warm and dry.  Psychiatric: He has a normal mood and affect.    ED Course  Procedures  DIAGNOSTIC STUDIES: Oxygen Saturation is 99% on RA, normal by my interpretation.    COORDINATION OF CARE: 7:58 PM Discussed treatment plan with pt at bedside and pt agreed to plan.   MDM   Final diagnoses:  Acute gout of left foot, unspecified cause    Pt placed on colchicine 1.2 mg given here, pt to take last 0.6 mg tab at home in one hour.  Indocin prescribed, hydrocodone prn pain.  Elevation, warm compresses.  PRN f/u if sx worsen.  Doubt infection, hx c/w gouty flare.    I personally performed the services described in this documentation, which was scribed in my presence. The recorded information has been reviewed and is accurate.   Burgess Amor, PA-C 04/14/15 2041  Rolland Porter, MD 04/14/15 2256

## 2015-04-15 MED FILL — Hydrocodone-Acetaminophen Tab 5-325 MG: ORAL | Qty: 6 | Status: AC

## 2015-05-29 ENCOUNTER — Encounter (HOSPITAL_COMMUNITY): Payer: Self-pay | Admitting: Emergency Medicine

## 2015-05-29 ENCOUNTER — Emergency Department (HOSPITAL_COMMUNITY): Payer: BLUE CROSS/BLUE SHIELD

## 2015-05-29 ENCOUNTER — Emergency Department (HOSPITAL_COMMUNITY)
Admission: EM | Admit: 2015-05-29 | Discharge: 2015-05-29 | Disposition: A | Discharge status: Home / Self Care | Payer: Self-pay | Attending: Emergency Medicine | Admitting: Emergency Medicine

## 2015-05-29 ENCOUNTER — Emergency Department (HOSPITAL_COMMUNITY): Payer: Self-pay

## 2015-05-29 DIAGNOSIS — Z79899 Other long term (current) drug therapy: Secondary | ICD-10-CM | POA: Insufficient documentation

## 2015-05-29 DIAGNOSIS — I10 Essential (primary) hypertension: Secondary | ICD-10-CM | POA: Insufficient documentation

## 2015-05-29 DIAGNOSIS — B379 Candidiasis, unspecified: Secondary | ICD-10-CM | POA: Insufficient documentation

## 2015-05-29 DIAGNOSIS — M109 Gout, unspecified: Secondary | ICD-10-CM | POA: Insufficient documentation

## 2015-05-29 DIAGNOSIS — F419 Anxiety disorder, unspecified: Secondary | ICD-10-CM | POA: Insufficient documentation

## 2015-05-29 DIAGNOSIS — Z791 Long term (current) use of non-steroidal anti-inflammatories (NSAID): Secondary | ICD-10-CM | POA: Insufficient documentation

## 2015-05-29 DIAGNOSIS — N492 Inflammatory disorders of scrotum: Secondary | ICD-10-CM | POA: Insufficient documentation

## 2015-05-29 DIAGNOSIS — N50819 Testicular pain, unspecified: Secondary | ICD-10-CM

## 2015-05-29 DIAGNOSIS — F17210 Nicotine dependence, cigarettes, uncomplicated: Secondary | ICD-10-CM | POA: Insufficient documentation

## 2015-05-29 LAB — URINALYSIS, ROUTINE W REFLEX MICROSCOPIC
Bilirubin Urine: NEGATIVE
Glucose, UA: NEGATIVE mg/dL
Ketones, ur: NEGATIVE mg/dL
Leukocytes, UA: NEGATIVE
NITRITE: NEGATIVE
Protein, ur: NEGATIVE mg/dL
Specific Gravity, Urine: 1.02 (ref 1.005–1.030)
pH: 5.5 (ref 5.0–8.0)

## 2015-05-29 LAB — URINE MICROSCOPIC-ADD ON

## 2015-05-29 LAB — CBG MONITORING, ED: GLUCOSE-CAPILLARY: 117 mg/dL — AB (ref 65–99)

## 2015-05-29 MED ORDER — DOXYCYCLINE HYCLATE 100 MG PO CAPS: 100.0000 mg | ORAL_CAPSULE | Freq: Two times a day (BID) | ORAL | Status: DC

## 2015-05-29 MED ORDER — BENAZEPRIL HCL 40 MG PO TABS: 40.0000 mg | ORAL_TABLET | Freq: Two times a day (BID) | ORAL | Status: DC

## 2015-05-29 MED ORDER — POVIDONE-IODINE 10 % EX SOLN
CUTANEOUS | Status: AC
  Filled 2015-05-29: qty 118

## 2015-05-29 MED ORDER — AMLODIPINE BESYLATE 5 MG PO TABS: 5.0000 mg | ORAL_TABLET | Freq: Every day | ORAL | Status: DC

## 2015-05-29 MED ORDER — LIDOCAINE-EPINEPHRINE (PF) 1 %-1:200000 IJ SOLN
20.0000 mL | Freq: Once | INTRAMUSCULAR | Status: DC
  Filled 2015-05-29: qty 20

## 2015-05-29 MED ORDER — IBUPROFEN 800 MG PO TABS
800.0000 mg | ORAL_TABLET | Freq: Once | ORAL | Status: AC
  Administered 2015-05-29: 800 mg via ORAL
  Filled 2015-05-29: qty 1

## 2015-05-29 MED ORDER — HYDROCODONE-ACETAMINOPHEN 5-325 MG PO TABS: 1.0000 | ORAL_TABLET | ORAL | Status: DC | PRN

## 2015-05-29 NOTE — ED Provider Notes (Signed)
CSN: [id number]     Arrival date & time 05/29/15  1804 History   First MD Initiated Contact with Patient 05/29/15 1824     Chief Complaint  Patient presents with  . Abscess     (Consider location/radiation/quality/duration/timing/severity/associated sxs/prior Treatment) HPI Comments: Patient reports scrotal "abscess" for the past several days. He reports history of the same requiring incision and drainage several months ago. States the pain recurred about 3 days ago. It is not bleeding or draining. No difficulty with urination. No fever or vomiting. No dysuria or hematuria. Left testicle is enlarged and tender which he says is baseline.  Patient is a 44 y.o. male presenting with abscess. The history is provided by the patient.  Abscess Associated symptoms: no fever, no headaches, no nausea and no vomiting     Past Medical History  Diagnosis Date  . Hypertension   . Gout   . Anxiety    Past Surgical History  Procedure Laterality Date  . Knee arthrocentesis    . Shoulder arthroscopy    . Appendectomy     Family History  Problem Relation Age of Onset  . Hypertension Mother   . Cancer Mother   . Heart failure Father   . Hypertension Other   . Diabetes Other   . Heart attack Other    Social History  Substance Use Topics  . Smoking status: Current Every Day Smoker -- 1.00 packs/day    Types: Cigarettes  . Smokeless tobacco: Never Used  . Alcohol Use: No    Review of Systems  Constitutional: Negative for fever, activity change and appetite change.  Respiratory: Negative for cough, chest tightness and shortness of breath.   Gastrointestinal: Negative for nausea, vomiting and abdominal pain.  Genitourinary: Positive for scrotal swelling and testicular pain. Negative for dysuria, urgency, frequency and flank pain.  Musculoskeletal: Negative for myalgias and arthralgias.  Skin: Negative for wound.  Neurological: Negative for dizziness, weakness and headaches.  A complete  10 system review of systems was obtained and all systems are negative except as noted in the HPI and PMH.      Allergies  Sulfa antibiotics and Sulfur  Home Medications   Prior to Admission medications   Medication Sig Start Date End Date Taking? Authorizing Provider  amLODipine (NORVASC) 5 MG tablet Take 1 tablet (5 mg total) by mouth daily. 05/29/15   Glynn OctaveStephen Issa Kosmicki, MD  benazepril (LOTENSIN) 40 MG tablet Take 1 tablet (40 mg total) by mouth 2 (two) times daily. 05/29/15   Glynn OctaveStephen Oswald Pott, MD  bisoprolol-hydrochlorothiazide [facility](ZIAC) 5-6.25 MG per tablet Take 1 tablet by mouth 2 (two) times daily. 01/21/15   Tammy Triplett, PA-C  bumetanide (BUMEX) 1 MG tablet Take 1 mg by mouth every other day.     Historical Provider, MD  cyclobenzaprine (FLEXERIL) 5 MG tablet Take 1 tablet (5 mg total) by mouth 3 (three) times daily as needed for muscle spasms. Patient not taking: Reported on 04/14/2015 02/25/15   Burgess AmorJulie Idol, PA-C  doxycycline (VIBRAMYCIN) 100 MG capsule Take 1 capsule (100 mg total) by mouth 2 (two) times daily. 05/29/15   Glynn OctaveStephen Shanie Mauzy, MD  DULoxetine (CYMBALTA) 60 MG capsule Take 60 mg by mouth daily.    Historical Provider, MD  HYDROcodone-acetaminophen (NORCO/VICODIN) 5-325 MG tablet Take 1 tablet by mouth every 4 (four) hours as needed. 05/29/15   Glynn OctaveStephen Kirstan Fentress, MD  indomethacin (INDOCIN) 50 MG capsule Take 1 capsule (50 mg total) by mouth 2 (two) times daily with a meal. 04/14/15  Raynelle Fanning Idol, PA-C   BP 175/104 mmHg  Pulse 90  Temp(Src) 98 F (36.7 C) (Oral)  Resp 20  Ht  (1.854 m)  Wt 290 lb (131.543 kg)  BMI 38.27 kg/m2  SpO2 97% Physical Exam  Constitutional: He is oriented to person, place, and time. He appears well-developed and well-nourished. No distress.  HENT:  Head: Normocephalic and atraumatic.  Mouth/Throat: Oropharynx is clear and moist. No oropharyngeal exudate.  Eyes: Conjunctivae and EOM are normal. Pupils are equal, round, and reactive to light.   Neck: Normal range of motion. Neck supple.  No meningismus.  Cardiovascular: Normal rate, regular rhythm, normal heart sounds and intact distal pulses.   No murmur heard. Pulmonary/Chest: Effort normal and breath sounds normal. No respiratory distress.  Abdominal: Soft. There is no tenderness. There is no rebound and no guarding.  Genitourinary:  Left testicle is larger than left right testicle and more firm. There is induration and the inferior aspect of the testicle without fluctuance.  Slight erythema of bilateral inguinal groin folds consistent with Candida.  Musculoskeletal: Normal range of motion. He exhibits no edema or tenderness.  Neurological: He is alert and oriented to person, place, and time. No cranial nerve deficit. He exhibits normal muscle tone. Coordination normal.  No ataxia on finger to nose bilaterally. No pronator drift. 5/5 strength throughout. CN 2-12 intact.Equal grip strength. Sensation intact.   Skin: Skin is warm.  Psychiatric: He has a normal mood and affect. His behavior is normal.  Nursing note and vitals reviewed.   ED Course  .Marland KitchenIncision and Drainage Date/Time: 05/29/2015 8:35 PM Performed by: Glynn Octave Authorized by: Glynn Octave Consent: Verbal consent obtained. Risks and benefits: risks, benefits and alternatives were discussed Consent given by: patient Patient understanding: patient states understanding of the procedure being performed Patient identity confirmed: verbally with patient and provided demographic data Time out: Immediately prior to procedure a "time out" was called to verify the correct patient, procedure, equipment, support staff and site/side marked as required. Type: abscess Body area: anogenital Location details: scrotal wall Anesthesia: local infiltration Local anesthetic: lidocaine 2% with epinephrine Anesthetic total: 5 ml Patient sedated: no Scalpel size: 11 Incision type: single straight Incision depth:  subcutaneous Complexity: complex Drainage: purulent Drainage amount: copious Wound treatment: wound left open Packing material: 1/4 in iodoform gauze Patient tolerance: Patient tolerated the procedure well with no immediate complications   (including critical care time) Labs Review Labs Reviewed  URINALYSIS, ROUTINE W REFLEX MICROSCOPIC (NOT AT [facility]) - Abnormal; Notable for the following:    Hgb urine dipstick LARGE (*)    All other components within normal limits  URINE MICROSCOPIC-ADD ON - Abnormal; Notable for the following:    Squamous Epithelial / LPF 0-5 (*)    Bacteria, UA RARE (*)    All other components within normal limits  CBG MONITORING, ED - Abnormal; Notable for the following:    Glucose-Capillary 117 (*)    All other components within normal limits    Imaging Review US Scrotum  05/29/2015  CLINICAL DATA:  Left testicular pain for 3 weeks EXAM: SCROTAL ULTRASOUND DOPPLER ULTRASOUND OF THE TESTICLES TECHNIQUE: Complete ultrasound examination of the testicles, epididymis, and other scrotal structures was performed. Color and spectral Doppler ultrasound were also utilized to evaluate blood flow to the testicles. COMPARISON:  None. FINDINGS: Right testicle Measurements: 4.3 x 2.3 x 2.8 cm. No mass or microlithiasis visualized. Left testicle Measurements: 3.8 x 1.9 x 2.7 cm. No mass or microlithiasis visualized.  Inferior to the left testicle there is a complex collection which measures 4.6 cm in greatest dimension. There is a fluid fluid level identified. By a clinical history purulent material can be expressed from this area and there is been recent incision and drainage of this area as well. Right epididymis:  Normal in size and appearance. Left epididymis:  Normal in size and appearance. Hydrocele:  Bilateral hydroceles are noted Varicocele:  None visualized. Pulsed Doppler interrogation of both testes demonstrates normal low resistance arterial and venous waveforms bilaterally.  IMPRESSION: Normal-appearing testicles bilaterally. Inferior to the left testicle, there is a complex lesion identified as described above. These changes would be most consistent with a scrotal wall abscess given the clinical history. Correlation with the physical exam is recommended. Surgical consultation is also recommended. Electronically Signed   By: Alcide Clever M.D.   On: 05/29/2015 19:51   [place] Art/ven Flow Abd Pelv Doppler  05/29/2015  CLINICAL DATA:  Left testicular pain for 3 weeks EXAM: SCROTAL ULTRASOUND DOPPLER ULTRASOUND OF THE TESTICLES TECHNIQUE: Complete ultrasound examination of the testicles, epididymis, and other scrotal structures was performed. Color and spectral Doppler ultrasound were also utilized to evaluate blood flow to the testicles. COMPARISON:  None. FINDINGS: Right testicle Measurements: 4.3 x 2.3 x 2.8 cm. No mass or microlithiasis visualized. Left testicle Measurements: 3.8 x 1.9 x 2.7 cm. No mass or microlithiasis visualized. Inferior to the left testicle there is a complex collection which measures 4.6 cm in greatest dimension. There is a fluid fluid level identified. By a clinical history purulent material can be expressed from this area and there is been recent incision and drainage of this area as well. Right epididymis:  Normal in size and appearance. Left epididymis:  Normal in size and appearance. Hydrocele:  Bilateral hydroceles are noted Varicocele:  None visualized. Pulsed Doppler interrogation of both testes demonstrates normal low resistance arterial and venous waveforms bilaterally. IMPRESSION: Normal-appearing testicles bilaterally. Inferior to the left testicle, there is a complex lesion identified as described above. These changes would be most consistent with a scrotal wall abscess given the clinical history. Correlation with the physical exam is recommended. Surgical consultation is also recommended. Electronically Signed   By: Alcide Clever M.D.   On: 05/29/2015  19:51   I have personally reviewed and evaluated these images and lab results as part of my medical decision-making.   EKG Interpretation None      MDM   Final diagnoses:  Testicle pain  Scrotal wall abscess   Pain to scrotum and left testicle. No fever or vomiting. No difficulty with urination. There is induration on exam without appreciable fluctuance or drainable abscess.  Ultrasound shows normal testicles but does show complex fluid collection is likely abscess. This is likely recurrent from patient's previous abscess in June as he did not follow-up.  Discussed with Dr. Sherron Monday of urology. He agrees with bedside incision and drainage.  Incision and drainage performed as above. Patient tolerated well. Instructed to follow-up with urology this week. We'll prescribe antibiotics we'll also refill blood pressure medications at his request. Discussed warm soaks,wound care and return precautions.     Glynn Octave, MD 05/29/15 2107

## 2015-05-29 NOTE — ED Notes (Addendum)
Pt reports scrotal "abscess" for last several days. Pt reports history of the same. Pt denies any gu symptoms. nad noted. Pt also reports is out of bp medication.

## 2015-05-29 NOTE — Discharge Instructions (Signed)
Abscess Take the antibiotics as prescribed. Do warm soaks twice daily as discussed. Follow up with the urologist. Return to the ED if you develop new or worsening symptoms. An abscess is an infected area that contains a collection of pus and debris.It can occur in almost any part of the body. An abscess is also known as a furuncle or boil. CAUSES  An abscess occurs when tissue gets infected. This can occur from blockage of oil or sweat glands, infection of hair follicles, or a minor injury to the skin. As the body tries to fight the infection, pus collects in the area and creates pressure under the skin. This pressure causes pain. People with weakened immune systems have difficulty fighting infections and get certain abscesses more often.  SYMPTOMS Usually an abscess develops on the skin and becomes a painful mass that is red, warm, and tender. If the abscess forms under the skin, you may feel a moveable soft area under the skin. Some abscesses break open (rupture) on their own, but most will continue to get worse without care. The infection can spread deeper into the body and eventually into the bloodstream, causing you to feel ill.  DIAGNOSIS  Your caregiver will take your medical history and perform a physical exam. A sample of fluid may also be taken from the abscess to determine what is causing your infection. TREATMENT  Your caregiver may prescribe antibiotic medicines to fight the infection. However, taking antibiotics alone usually does not cure an abscess. Your caregiver may need to make a small cut (incision) in the abscess to drain the pus. In some cases, gauze is packed into the abscess to reduce pain and to continue draining the area. HOME CARE INSTRUCTIONS   Only take over-the-counter or prescription medicines for pain, discomfort, or fever as directed by your caregiver.  If you were prescribed antibiotics, take them as directed. Finish them even if you start to feel better.  If gauze  is used, follow your caregiver's directions for changing the gauze.  To avoid spreading the infection:  Keep your draining abscess covered with a bandage.  Wash your hands well.  Do not share personal care items, towels, or whirlpools with others.  Avoid skin contact with others.  Keep your skin and clothes clean around the abscess.  Keep all follow-up appointments as directed by your caregiver. SEEK MEDICAL CARE IF:   You have increased pain, swelling, redness, fluid drainage, or bleeding.  You have muscle aches, chills, or a general ill feeling.  You have a fever. MAKE SURE YOU:   Understand these instructions.  Will watch your condition.  Will get help right away if you are not doing well or get worse.   This information is not intended to replace advice given to you by your health care provider. Make sure you discuss any questions you have with your health care provider.   Document Released: 04/01/2005 Document Revised: 12/22/2011 Document Reviewed: 09/04/2011 Else[employer] Interactive Patient Education [employer]2016 [employer]

## 2015-07-22 ENCOUNTER — Ambulatory Visit: Payer: Self-pay | Admitting: Family Medicine

## 2015-07-23 ENCOUNTER — Encounter: Payer: Self-pay | Admitting: Family Medicine

## 2015-07-25 ENCOUNTER — Encounter (HOSPITAL_COMMUNITY): Payer: Self-pay | Admitting: Emergency Medicine

## 2015-07-25 ENCOUNTER — Emergency Department (HOSPITAL_COMMUNITY)
Admission: EM | Admit: 2015-07-25 | Discharge: 2015-07-25 | Disposition: A | Discharge status: Home / Self Care | Payer: 59 | Attending: Emergency Medicine | Admitting: Emergency Medicine

## 2015-07-25 ENCOUNTER — Emergency Department (HOSPITAL_COMMUNITY): Payer: 59

## 2015-07-25 DIAGNOSIS — I10 Essential (primary) hypertension: Secondary | ICD-10-CM | POA: Insufficient documentation

## 2015-07-25 DIAGNOSIS — F419 Anxiety disorder, unspecified: Secondary | ICD-10-CM | POA: Insufficient documentation

## 2015-07-25 DIAGNOSIS — F17210 Nicotine dependence, cigarettes, uncomplicated: Secondary | ICD-10-CM | POA: Diagnosis not present

## 2015-07-25 DIAGNOSIS — M109 Gout, unspecified: Secondary | ICD-10-CM | POA: Insufficient documentation

## 2015-07-25 DIAGNOSIS — N5089 Other specified disorders of the male genital organs: Secondary | ICD-10-CM | POA: Insufficient documentation

## 2015-07-25 DIAGNOSIS — Z79899 Other long term (current) drug therapy: Secondary | ICD-10-CM | POA: Insufficient documentation

## 2015-07-25 DIAGNOSIS — L729 Follicular cyst of the skin and subcutaneous tissue, unspecified: Secondary | ICD-10-CM

## 2015-07-25 LAB — CBC WITH DIFFERENTIAL/PLATELET
BASOS ABS: 0 10*3/uL (ref 0.0–0.1)
BASOS PCT: 0 %
EOS ABS: 0.5 10*3/uL (ref 0.0–0.7)
Eosinophils Relative: 5 %
HCT: 40.2 % (ref 39.0–52.0)
Hemoglobin: 13.9 g/dL (ref 13.0–17.0)
Lymphocytes Relative: 32 %
Lymphs Abs: 2.9 10*3/uL (ref 0.7–4.0)
MCH: 28.4 pg (ref 26.0–34.0)
MCHC: 34.6 g/dL (ref 30.0–36.0)
MCV: 82 fL (ref 78.0–100.0)
Monocytes Absolute: 0.7 10*3/uL (ref 0.1–1.0)
Monocytes Relative: 8 %
Neutro Abs: 4.9 10*3/uL (ref 1.7–7.7)
Neutrophils Relative %: 55 %
PLATELETS: 223 10*3/uL (ref 150–400)
RBC: 4.9 MIL/uL (ref 4.22–5.81)
RDW: 13.1 % (ref 11.5–15.5)
WBC: 9 10*3/uL (ref 4.0–10.5)

## 2015-07-25 LAB — BASIC METABOLIC PANEL
Anion gap: 8 (ref 5–15)
BUN: 15 mg/dL (ref 6–20)
CO2: 26 mmol/L (ref 22–32)
Calcium: 9 mg/dL (ref 8.9–10.3)
Chloride: 108 mmol/L (ref 101–111)
Creatinine, Ser: 0.83 mg/dL (ref 0.61–1.24)
GFR calc Af Amer: >60 mL/min (ref >60)
Glucose, Bld: 140 mg/dL — ABNORMAL HIGH (ref 65–99)
POTASSIUM: 3.7 mmol/L (ref 3.5–5.1)
SODIUM: 142 mmol/L (ref 135–145)

## 2015-07-25 MED ORDER — SODIUM CHLORIDE 0.9 % IV SOLN
INTRAVENOUS | Status: DC
  Administered 2015-07-25: 20:00:00 via INTRAVENOUS

## 2015-07-25 MED ORDER — BENAZEPRIL HCL 40 MG PO TABS: 40.0000 mg | ORAL_TABLET | Freq: Two times a day (BID) | ORAL | Status: AC

## 2015-07-25 MED ORDER — AMLODIPINE BESYLATE 5 MG PO TABS: 5.0000 mg | ORAL_TABLET | Freq: Every day | ORAL | Status: AC

## 2015-07-25 NOTE — ED Provider Notes (Signed)
CSN: [id number]     Arrival date & time 07/25/15  1641 History   First MD Initiated Contact with Patient 07/25/15 1747     Chief Complaint  Patient presents with  . Abscess     (Consider location/radiation/quality/duration/timing/severity/associated sxs/prior Treatment) Patient is a 45 y.o. male presenting with abscess. The history is provided by the patient.  Abscess Associated symptoms: no fever and no headaches    patient with complaint of a chronic mass to the left scrotal area since June. Never completely goes away in and seems to keep coming back. Patient stated there is been getting bigger lately it frequently drains a thin watery yellow stuff. Patient seen in June for this and patient also seen in November for this. Patient has not followed up with urology. Patient also has a history of hypertension and he's been out of his hypertensive meds. Patient denies fevers. Denies sniffing abdominal pain. Denies any pain with urination. Associated with the mild discomfort no severe pain.  Past Medical History  Diagnosis Date  . Hypertension   . Gout   . Anxiety    Past Surgical History  Procedure Laterality Date  . Knee arthrocentesis    . Shoulder arthroscopy    . Appendectomy     Family History  Problem Relation Age of Onset  . Hypertension Mother   . Cancer Mother   . Heart failure Father   . Hypertension Other   . Diabetes Other   . Heart attack Other    Social History  Substance Use Topics  . Smoking status: Current Every Day Smoker -- 1.00 packs/day    Types: Cigarettes  . Smokeless tobacco: Never Used  . Alcohol Use: No    Review of Systems  Constitutional: Negative for fever.  HENT: Negative for congestion.   Eyes: Negative for redness.  Respiratory: Negative for shortness of breath.   Cardiovascular: Negative for chest pain.  Gastrointestinal: Negative for abdominal pain.  Genitourinary: Positive for scrotal swelling. Negative for dysuria, hematuria and  testicular pain.  Musculoskeletal: Negative for back pain.  Skin: Negative for rash.  Neurological: Negative for headaches.  Hematological: Does not bruise/bleed easily.  Psychiatric/Behavioral: Negative for confusion.      Allergies  Sulfa antibiotics and Sulfur  Home Medications   Prior to Admission medications   Medication Sig Start Date End Date Taking? Authorizing Provider  amLODipine-benazepril (LOTREL) 5-40 MG capsule Take 1 capsule by mouth daily.   Yes Historical Provider, MD  bisoprolol-hydrochlorothiazide (ZIAC) 5-6.25 MG per tablet Take 1 tablet by mouth 2 (two) times daily. 01/21/15  Yes Tammy Triplett, PA-C  DULoxetine (CYMBALTA) 60 MG capsule Take 60 mg by mouth daily.   Yes Historical Provider, MD  indomethacin (INDOCIN) 50 MG capsule Take 1 capsule (50 mg total) by mouth 2 (two) times daily with a meal. 04/14/15  Yes Burgess Amor, PA-C  amLODipine (NORVASC) 5 MG tablet Take 1 tablet (5 mg total) by mouth daily. 07/25/15   Vanetta Mulders, MD  benazepril (LOTENSIN) 40 MG tablet Take 1 tablet (40 mg total) by mouth 2 (two) times daily. 07/25/15   Vanetta Mulders, MD  bumetanide (BUMEX) 1 MG tablet Take 1 mg by mouth every other day.     Historical Provider, MD  cyclobenzaprine (FLEXERIL) 5 MG tablet Take 1 tablet (5 mg total) by mouth 3 (three) times daily as needed for muscle spasms. Patient not taking: Reported on 04/14/2015 02/25/15   Burgess Amor, PA-C  doxycycline (VIBRAMYCIN) 100 MG capsule Take 1  capsule (100 mg total) by mouth 2 (two) times daily. Patient not taking: Reported on 07/25/2015 05/29/15   Glynn Octave, MD  HYDROcodone-acetaminophen (NORCO/VICODIN) 5-325 MG tablet Take 1 tablet by mouth every 4 (four) hours as needed. Patient not taking: Reported on 07/25/2015 05/29/15   Glynn Octave, MD   BP 140/85 mmHg  Pulse 71  Temp(Src) 99.3 F (37.4 C) (Temporal)  Resp 20  SpO2 98% Physical Exam  Constitutional: He is oriented to person, place, and time. He  appears well-developed and well-nourished. No distress.  HENT:  Head: Normocephalic and atraumatic.  Mouth/Throat: Oropharynx is clear and moist.  Eyes: Conjunctivae are normal. Pupils are equal, round, and reactive to light.  Neck: Normal range of motion. Neck supple.  Cardiovascular: Normal rate and normal heart sounds.   No murmur heard. Pulmonary/Chest: Effort normal and breath sounds normal.  Abdominal: Soft. Bowel sounds are normal. There is no tenderness.  Genitourinary: Penis normal.  Testicles distended bilaterally nontender no mass. Left scrotal area with a bout a 4 x 4 centimeter area of thickness and fullness no erythema nontender. Small punctate opening with drainage of some serous purulent material no thick pus. No fluctuance. Right scrotum is normal. No hernias.  Musculoskeletal: Normal range of motion.  Neurological: He is alert and oriented to person, place, and time. No cranial nerve deficit. He exhibits normal muscle tone. Coordination normal.  Skin: Skin is warm. No rash noted. No erythema.  Nursing note and vitals reviewed.   ED Course  Procedures (including critical care time) Labs Review Labs Reviewed  BASIC METABOLIC PANEL - Abnormal; Notable for the following:    Glucose, Bld 140 (*)    All other components within normal limits  CBC WITH DIFFERENTIAL/PLATELET    Imaging Review US Scrotum  07/25/2015  CLINICAL DATA:  45 year old male with history of palpable mass in the scrotum and testicular pain. Prior history of scrotal abscess. EXAM: ULTRASOUND OF SCROTUM TECHNIQUE: Complete ultrasound examination of the testicles, epididymis, and other scrotal structures was performed. COMPARISON:  Testicular ultrasound 05/29/2015. FINDINGS: Right testicle Measurements: 4.5 x 2.4 x 3.3 cm. No mass or microlithiasis visualized. Left testicle Measurements: 3.7 x 2.5 x 2.8 cm. No mass or microlithiasis visualized. Right epididymis:  Normal in size and appearance. Left  epididymis:  Normal in size and appearance. Hydrocele:  Small bilateral hydroceles. Varicocele:  None visualized. Other: Complex heterogeneously echogenic 3.8 x 3.4 x 3.5 cm area underneath the left testicle, with what appears to be some layering debris, and a thick wall, concerning for potential abscess. IMPRESSION: 1. 3.8 x 3.4 x 3.5 cm area beneath the left testicle suspicious for potential abscess, as above. 2. Small bilateral hydroceles. 3. Testicles and epididymides are otherwise normal. Electronically Signed   By: Trudie Reed M.D.   On: 07/25/2015 20:28   [place] Art/ven Flow Abd Pelv Doppler  07/25/2015  CLINICAL DATA:  45 year old male with history of palpable mass in the scrotum and testicular pain. Prior history of scrotal abscess. EXAM: ULTRASOUND OF SCROTUM TECHNIQUE: Complete ultrasound examination of the testicles, epididymis, and other scrotal structures was performed. COMPARISON:  Testicular ultrasound 05/29/2015. FINDINGS: Right testicle Measurements: 4.5 x 2.4 x 3.3 cm. No mass or microlithiasis visualized. Left testicle Measurements: 3.7 x 2.5 x 2.8 cm. No mass or microlithiasis visualized. Right epididymis:  Normal in size and appearance. Left epididymis:  Normal in size and appearance. Hydrocele:  Small bilateral hydroceles. Varicocele:  None visualized. Other: Complex heterogeneously echogenic 3.8 x 3.4 x 3.5  cm area underneath the left testicle, with what appears to be some layering debris, and a thick wall, concerning for potential abscess. IMPRESSION: 1. 3.8 x 3.4 x 3.5 cm area beneath the left testicle suspicious for potential abscess, as above. 2. Small bilateral hydroceles. 3. Testicles and epididymides are otherwise normal. Electronically Signed   By: Trudie Reed M.D.   On: 07/25/2015 20:28   I have personally reviewed and evaluated these images and lab results as part of my medical decision-making.   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 With a draining sinus. They recommend follow-up with them and they'll remove the cyst in its entirety. Patient's labs without any significant abnormalities. Patient has a history of hypertension. In out of his meds. Will renew them. Patient's renal function is normal. The scrotal cyst is showing no evidence of any acute infection. This is consistent with a chronic cyst. Urology recommended no antibiotics.    Vanetta Mulders, MD 07/25/15 2122

## 2015-07-25 NOTE — ED Notes (Signed)
Pt states that he has a large abscess the size of a baseball on his left testicle.  States that he is also out of all of his medications.

## 2015-07-25 NOTE — Discharge Instructions (Signed)
There are important to follow-up with urology for the scrotal cyst. Since chronic form and will need to be cut out. He been given the phone number for their clinic up here in [place] as well as Dr. Ronne Binning who I spoke to tonight.  Take the blood pressure medicines as directed.  Resource guide provided below to help you find a regular doctor.    Emergency Department Resource Guide 1) Find a Doctor and Pay Out of Pocket Although you won't have to find out who is covered by your insurance plan, it is a good idea to ask around and get recommendations. You will then need to call the office and see if the doctor you have chosen will accept you as a new patient and what types of options they offer for patients who are self-pay. Some doctors offer discounts or will set up payment plans for their patients who do not have insurance, but you will need to ask so you aren't surprised when you get to your appointment.  2) Contact Your Local Health Department Not all health departments have doctors that can see patients for sick visits, but many do, so it is worth a call to see if yours does. If you don't know where your local health department is, you can check in your phone book. The CDC also has a tool to help you locate your state's health department, and many state websites also have listings of all of their local health departments.  3) Find a Walk-in Clinic If your illness is not likely to be very severe or complicated, you may want to try a walk in clinic. These are popping up all over the country in pharmacies, drugstores, and shopping centers. They're usually staffed by nurse practitioners or physician assistants that have been trained to treat common illnesses and complaints. They're usually fairly quick and inexpensive. However, if you have serious medical issues or chronic medical problems, these are probably not your best option.  No Primary Care Doctor: - Call Health Connect at  [phone] -  they can help you locate a primary care doctor that  accepts your insurance, provides certain services, etc. - Physician Referral Service- [phone]  Chronic Pain Problems: Organization         Address  Phone   Notes  [facility]  [phone] Patients need to be referred by their primary care doctor.   Medication Assistance: Organization         Address  Phone   Notes  [facility] Medication [facility] [address] [place], [place] [address] [phone] --Must be a resident of [facility] -- Must have NO insurance coverage whatsoever (no Medicaid/ Medicare, etc.) -- The pt. MUST have a primary care doctor that directs their care regularly and follows them in the community   [facility]ist  [phone]   [employer]  [phone]    Agencies that provide inexpensive medical care: Organization         Address  Phone   Notes  [facility]  [phone]   [facility]    [phone]   [facility] [place] [address] [place], [place] [address] [phone]   [facility] [address] [place] [place] [phone]   Planned Parenthood    [phone]   [facility]    [phone]   [facility] and [facility]  [address]  [place]ne [place] Phone:  [phone], Fax:  [phone] Hours of Operation:  9 am - 6 pm, M-F.  Also accepts Medicaid/Medicare and self-pay.  [facility]  [address], [place] Phone: [phone], Fax: [phone]. Hours of Operation:  8:30 am - 5:30 pm, M-F.  Also accepts Medicaid and self-pay.  [facility] [place] [address], [place] Phone: [phone]   [facility] [address] Natasha Bence [place], [place] [phone], Ext. [phone] Mondays & Thursdays: 7-9 AM.  First 15 patients are seen on a first come, first serve basis.    Medicaid-accepting  [facility] Providers:  Organization         Address  Phone   Notes  [facility] [address], [place] [phone] Also accepts self-pay patients.  [facility] [address] Laurell Josephs [place], [place]  [phone]   [facility] [address], [place] [phone]   [facility] [address] [place] [phone]   Renaye Rakers [address], [place]   [phone] Only accepts [place] Access [place] patients after they have their name applied to their card.   Self-Pay (no insurance) in [facility]:  Organization         Address  Phone   Notes  Sickle Cell Patients, [facility] [address] [place], [place] [phone]   [facility] [address] [place], [place] [phone]   [facility] [place]  [address], [place] [phone]   [facility]/Dr. Osei-Bonsu  [address] [place] or [address], [place] [phone] Phone number for both [place] and [place] locations is the same.  Urgent Medical and [facility] [address] [place] [phone]   [facility] [address], [place] or [address] [phone]   [facility] [address], [place] [phone], phone; [phone], fax Sees patients 1st and 3rd Saturday of every month.  Must not qualify for public or private insurance (i.e. Medicaid, Medicare, [place] Health Choice, Veterans' Benefits)  Household income should be no more than 200% of the poverty level The clinic cannot treat you if you are pregnant or think you are pregnant  Sexually transmitted diseases are not treated at the clinic.    Dental Care: Organization         Address  Phone  Notes  [facility] Department of [facility] [address] [place], [place] [phone] Accepts children up to age 1 who are enrolled in [place] or [place] Health Choice; pregnant women with a Medicaid card; and children who have applied for Medicaid or [place] Health Choice, but were declined, whose parents can pay a reduced fee at time of service.  [facility] Department of [facility]  [address], [place] [phone] Accepts children up to age 53 who are enrolled in [place] or [place] Health Choice; pregnant women with a Medicaid card; and children who have applied for Medicaid or [place] Health Choice, but were declined, whose parents can pay a reduced fee at time of service.  [facility]  [address] [place], [place] [phone] Patients are seen  by appointment only. Walk-ins are not accepted. [facility] will see patients 65 years of age and older. Monday - Tuesday (8am-5pm) Most Wednesdays (8:30-5pm) $30 per visit, cash only  [facility] Adult Dental Access [facility]  [address], [facility] [phone] Patients are seen by appointment only. Walk-ins are not accepted. [facility] will see patients 63 years of age and older. One Wednesday Evening (Monthly: Volunteer Based).  $30 per visit, cash only  [employer]  [phone] for adults; Children under age 74, call Graduate Pediatric Dentistry at [phone]. Children aged 53-14, please call [phone] to request a pediatric application.  Dental services are provided in all areas of dental care including fillings, crowns and bridges, complete and partial dentures, implants, gum treatment, root canals, and extractions. Preventive care is also provided. Treatment is provided to both adults and children. Patients are selected via a lottery and there is often a waiting list.   [facility] [address], [place]  [phone] [email or web address]   [facility] [address] [place] [address] [place], [place] [phone], Ext. [phone] Second and Fourth Thursday of each month, opens at 6:30 AM; Clinic ends at 9 AM.  Patients are seen on a first-come first-served basis, and a limited number are seen during each clinic.   [facility]  [address] Ether Griffins [place], [place] [phone]   Eligibility Requirements You must have lived in [place], [place], or [place] counties for at least the last three months.   You cannot be eligible for state or federal sponsored [place], including [employer], [place], or [employer].   You generally cannot be eligible for healthcare insurance through your employer.    How to apply: Eligibility screenings are held every Tuesday and Wednesday afternoon from 1:00 pm until 4:00 pm. You do not need an appointment for the interview!  [facility] [address] [place], [place] [phone]   [facility] Health Department  [phone]   [facility] Health Department  [phone]   [facility] Health Department  [phone]    Behavioral Health Resources in the Community: Intensive Outpatient Programs Organization         Address  Phone  Notes  [facility] Services [address] [place], [place] [phone]   [facility] Outpatient [address] [place], [place] [phone]   [facility] [address] [place], [place]  [phone]   [facility] Mental Health [address],  [place], [place] [phone] or [phone]   Substance Abuse Resources Organization         Address  Phone  Notes  [facility] Drug Services  [phone]   [facility]  [phone]   [facility] [place]  [phone]   Floydene Flock  [phone]   Residential & Outpatient Substance Abuse Program  [phone]   Psychological Services Organization         Address  Phone  Notes  [facility] Behavioral Health   [phone]   [facility]  [phone]   [facility] [address] [place] [phone] or [phone]    Mobile Crisis Teams Organization         Address  Phone  Notes  [facility], Mobile Crisis Care Unit  [phone]   [facility]  [address] [place], [place] [phone]   [facility] [address] [place]  [phone]    Self-Help/Support Groups Organization         Address  Phone             Notes  [facility] [place] - variety of support groups  336- I7437963 Call for more information  Narcotics Anonymous (NA), [facility] [address], [employer] [place]  2 meetings at this location   Statistician         Address  Phone  Notes  [facility] [address],    [place]  [phone]   [facility]  [address], [place] [address], [place], [place] [phone]   [facility] Treatment Facility [address] [place], [place] [phone] Admissions: 8am-3pm M-F  [facility] [address]    [place], [place] [phone]   [facility] [address] [place], [place], [place] [phone]   [facility] [address]  [place], [place] [phone]   [facility] - Intensive Outpatient [address], [place], [place] [phone]   [facility] (Addiction Recovery [facility] [address] [place]  [place], [place] [phone] or [phone]   [facility] ([facility]) [address], [place], [place] [phone] Accepts Medicaid  [facility] [address],  [place] [phone] Substance Abuse/Addiction Treatment   [facility] Organization         Address  Phone  Notes  [facility]  [phone]   Angie Fava, PhD [address] Ervin Knack [place], [place]   [phone] or  [phone]   [facility] Behavioral   [address] [place], [place] [phone]   [facility] [address] [place], [place] [phone] Insurance/Medicaid/sponsorship through [facility] [address]                                    [place], [place] [phone] Therapy/tele-psych/case  [facility] [address], [place] [phone]    Dr. Lolly Mustache  [phone]   [facility] [place]  [employer] [facility] Dept. 1) [address] [place] 2) [address] [place] 3)  [address], [place] [phone]  [phone]   [facility] Child Abuse Hotline 5481983938 or [phone] (After Hours)

## 2015-08-11 ENCOUNTER — Emergency Department (HOSPITAL_COMMUNITY)
Admission: EM | Admit: 2015-08-11 | Discharge: 2015-08-11 | Disposition: A | Discharge status: Home / Self Care | Payer: 59 | Attending: Emergency Medicine | Admitting: Emergency Medicine

## 2015-08-11 ENCOUNTER — Encounter (HOSPITAL_COMMUNITY): Payer: Self-pay | Admitting: *Deleted

## 2015-08-11 DIAGNOSIS — Z79899 Other long term (current) drug therapy: Secondary | ICD-10-CM | POA: Diagnosis not present

## 2015-08-11 DIAGNOSIS — F419 Anxiety disorder, unspecified: Secondary | ICD-10-CM | POA: Insufficient documentation

## 2015-08-11 DIAGNOSIS — F17210 Nicotine dependence, cigarettes, uncomplicated: Secondary | ICD-10-CM | POA: Insufficient documentation

## 2015-08-11 DIAGNOSIS — K029 Dental caries, unspecified: Secondary | ICD-10-CM | POA: Insufficient documentation

## 2015-08-11 DIAGNOSIS — I10 Essential (primary) hypertension: Secondary | ICD-10-CM | POA: Insufficient documentation

## 2015-08-11 DIAGNOSIS — M109 Gout, unspecified: Secondary | ICD-10-CM | POA: Diagnosis not present

## 2015-08-11 DIAGNOSIS — K0889 Other specified disorders of teeth and supporting structures: Secondary | ICD-10-CM

## 2015-08-11 DIAGNOSIS — Z791 Long term (current) use of non-steroidal anti-inflammatories (NSAID): Secondary | ICD-10-CM | POA: Insufficient documentation

## 2015-08-11 MED ORDER — CLINDAMYCIN HCL 150 MG PO CAPS: 150.0000 mg | ORAL_CAPSULE | Freq: Four times a day (QID) | ORAL | Status: DC

## 2015-08-11 NOTE — ED Notes (Signed)
Pt comes in with right side lower jaw dental pain. Pt is scheduled to have all of his lower teeth removed in 2 weeks. Pain got increasingly worse so he come here. No respiratory distress noted. Pt BP 164/97 upon triage. Pt took BP medication prior to arrival to ED.

## 2015-08-11 NOTE — ED Provider Notes (Signed)
CSN: [id number]     Arrival date & time 08/11/15  1129 History  By signing my name below, I, Elon Spanner, attest that this documentation has been prepared under the direction and in the presence of Trisha Mangle, PA-C. Electronically Signed: Elon Spanner ED Scribe. 08/11/2015. 11:41 AM.    Chief Complaint  Patient presents with  . Dental Pain   The history is provided by the patient. No language interpreter was used.   HPI Comments: Dave Rowland is a 45 y.o. male with hx of HTN who presents to the Emergency Department complaining of worsened, chronic, moderate generalized dental pain that worsened last night.  He also notes some mild swelling in the surrounding area.  The patient is scheduled for extraction of all his lower teeth on 2/15 and has previously had all of his upper teeth extracted.    Past Medical History  Diagnosis Date  . Hypertension   . Gout   . Anxiety    Past Surgical History  Procedure Laterality Date  . Knee arthrocentesis    . Shoulder arthroscopy    . Appendectomy     Family History  Problem Relation Age of Onset  . Hypertension Mother   . Cancer Mother   . Heart failure Father   . Hypertension Other   . Diabetes Other   . Heart attack Other    Social History  Substance Use Topics  . Smoking status: Current Every Day Smoker -- 1.00 packs/day    Types: Cigarettes  . Smokeless tobacco: Never Used  . Alcohol Use: No    Review of Systems  A complete 10 system review of systems was obtained and all systems are negative except as noted in the HPI and PMH.   Allergies  Sulfa antibiotics and Sulfur  Home Medications   Prior to Admission medications   Medication Sig Start Date End Date Taking? Authorizing Provider  amLODipine (NORVASC) 5 MG tablet Take 1 tablet (5 mg total) by mouth daily. 07/25/15   Vanetta Mulders, MD  amLODipine-benazepril (LOTREL) 5-40 MG capsule Take 1 capsule by mouth daily.    Historical Provider, MD  benazepril (LOTENSIN) 40  MG tablet Take 1 tablet (40 mg total) by mouth 2 (two) times daily. 07/25/15   Vanetta Mulders, MD  bisoprolol-hydrochlorothiazide (ZIAC) 5-6.25 MG per tablet Take 1 tablet by mouth 2 (two) times daily. 01/21/15   Tammy Triplett, PA-C  bumetanide (BUMEX) 1 MG tablet Take 1 mg by mouth every other day.     Historical Provider, MD  cyclobenzaprine (FLEXERIL) 5 MG tablet Take 1 tablet (5 mg total) by mouth 3 (three) times daily as needed for muscle spasms. Patient not taking: Reported on 04/14/2015 02/25/15   Burgess Amor, PA-C  doxycycline (VIBRAMYCIN) 100 MG capsule Take 1 capsule (100 mg total) by mouth 2 (two) times daily. Patient not taking: Reported on 07/25/2015 05/29/15   Glynn Octave, MD  DULoxetine (CYMBALTA) 60 MG capsule Take 60 mg by mouth daily.    Historical Provider, MD  HYDROcodone-acetaminophen (NORCO/VICODIN) 5-325 MG tablet Take 1 tablet by mouth every 4 (four) hours as needed. Patient not taking: Reported on 07/25/2015 05/29/15   Glynn Octave, MD  indomethacin (INDOCIN) 50 MG capsule Take 1 capsule (50 mg total) by mouth 2 (two) times daily with a meal. 04/14/15   Burgess Amor, PA-C   BP 164/97 mmHg  Pulse 79  Temp(Src) 97.8 F (36.6 C) (Oral)  Resp 16  Ht  (1.854 m)  Wt 305  lb (138.347 kg)  BMI 40.25 kg/m2  SpO2 100% Physical Exam  Constitutional: He is oriented to person, place, and time. He appears well-developed and well-nourished. No distress.  HENT:  Head: Normocephalic and atraumatic.  Dentures top of mouth.  Full lower teeth black, decayed.   Eyes: Conjunctivae and EOM are normal.  Neck: Neck supple. No tracheal deviation present.  Cardiovascular: Normal rate.   Pulmonary/Chest: Effort normal. No respiratory distress.  Musculoskeletal: Normal range of motion.  Neurological: He is alert and oriented to person, place, and time.  Skin: Skin is warm and dry.  Psychiatric: He has a normal mood and affect. His behavior is normal.  Nursing note and vitals  reviewed.   ED Course  Procedures (including critical care time)  DIAGNOSTIC STUDIES: Oxygen Saturation is 100% on RA, normal by my interpretation.    COORDINATION OF CARE:  11:58 AM Discussed plan to rx antibiotic.  Patient acknowledges and agrees with plan.    Labs Review Labs Reviewed - No data to display  Imaging Review No results found. I have personally reviewed and evaluated these images and lab results as part of my medical decision-making.   EKG Interpretation None      MDM   Final diagnoses:  Toothache   Clindamycin   I personally performed the services described in this documentation, which was scribed in my presence. The recorded information has been reviewed and is accurate.  Lonia Skinner [place], PA-C 08/11/15 1204  Doug Sou, MD 08/11/15 [phone]

## 2015-08-11 NOTE — Discharge Instructions (Signed)

## 2015-09-28 ENCOUNTER — Emergency Department (HOSPITAL_COMMUNITY)
Admission: EM | Admit: 2015-09-28 | Discharge: 2015-09-28 | Disposition: A | Discharge status: Home / Self Care | Payer: 59 | Attending: Emergency Medicine | Admitting: Emergency Medicine

## 2015-09-28 ENCOUNTER — Encounter (HOSPITAL_COMMUNITY): Payer: Self-pay | Admitting: *Deleted

## 2015-09-28 DIAGNOSIS — Z760 Encounter for issue of repeat prescription: Secondary | ICD-10-CM | POA: Diagnosis not present

## 2015-09-28 DIAGNOSIS — I10 Essential (primary) hypertension: Secondary | ICD-10-CM | POA: Diagnosis not present

## 2015-09-28 DIAGNOSIS — Z791 Long term (current) use of non-steroidal anti-inflammatories (NSAID): Secondary | ICD-10-CM | POA: Diagnosis not present

## 2015-09-28 DIAGNOSIS — Z79899 Other long term (current) drug therapy: Secondary | ICD-10-CM | POA: Insufficient documentation

## 2015-09-28 DIAGNOSIS — K029 Dental caries, unspecified: Secondary | ICD-10-CM | POA: Diagnosis not present

## 2015-09-28 DIAGNOSIS — K0889 Other specified disorders of teeth and supporting structures: Secondary | ICD-10-CM

## 2015-09-28 DIAGNOSIS — F17210 Nicotine dependence, cigarettes, uncomplicated: Secondary | ICD-10-CM | POA: Insufficient documentation

## 2015-09-28 MED ORDER — HYDROCODONE-ACETAMINOPHEN 5-325 MG PO TABS
1.0000 | ORAL_TABLET | Freq: Once | ORAL | Status: AC
  Administered 2015-09-28: 1 via ORAL
  Filled 2015-09-28: qty 1

## 2015-09-28 MED ORDER — HYDROCODONE-ACETAMINOPHEN 5-325 MG PO TABS: ORAL_TABLET | ORAL | Status: DC

## 2015-09-28 MED ORDER — PENICILLIN V POTASSIUM 500 MG PO TABS: 500.0000 mg | ORAL_TABLET | Freq: Four times a day (QID) | ORAL | Status: AC

## 2015-09-28 MED ORDER — PENICILLIN V POTASSIUM 250 MG PO TABS
500.0000 mg | ORAL_TABLET | Freq: Once | ORAL | Status: AC
  Administered 2015-09-28: 500 mg via ORAL
  Filled 2015-09-28: qty 2

## 2015-09-28 MED ORDER — BISOPROLOL-HYDROCHLOROTHIAZIDE 5-6.25 MG PO TABS: 1.0000 | ORAL_TABLET | Freq: Every day | ORAL | Status: AC

## 2015-09-28 NOTE — ED Notes (Signed)
Pt reports pain to a tooth on the bottom right side of his jaw.

## 2015-09-28 NOTE — Discharge Instructions (Signed)
Medicine Refill at the Emergency Department  We have refilled your medicine today, but it is best for you to get refills through your primary health care provider's office. In the future, please plan ahead so you do not need to get refills from the emergency department.  If the medicine we refilled was a maintenance medicine, you may have received only enough to get you by until you are able to see your regular health care provider.     This information is not intended to replace advice given to you by your health care provider. Make sure you discuss any questions you have with your health care provider.     Document Released: 10/09/2003 Document Revised: 07/13/2014 Document Reviewed: 09/29/2013  Elsevier Interactive Patient Education 2016 [employer]

## 2015-09-28 NOTE — ED Provider Notes (Signed)
CSN: [id number]     Arrival date & time 09/28/15  1904 History   First MD Initiated Contact with Patient 09/28/15 2010     Chief Complaint  Patient presents with  . Dental Pain     (Consider location/radiation/quality/duration/timing/severity/associated sxs/prior Treatment) HPI   Dave Rowland is a 45 y.o. male who presents to the Emergency Department complaining of right sided tooth pain for several days.  He states the pain began gradually and has worsened throughout the day.  Pain is worse with chewing and cold fluids.  He states he has tried OTC analgesics without relief. He also requests refill of his blood pressure medication.  He denies facial swelling, fever, chills, difficulty swallowing or neck pain     Past Medical History  Diagnosis Date  . Hypertension   . Gout   . Anxiety    Past Surgical History  Procedure Laterality Date  . Knee arthrocentesis    . Shoulder arthroscopy    . Appendectomy     Family History  Problem Relation Age of Onset  . Hypertension Mother   . Cancer Mother   . Heart failure Father   . Hypertension Other   . Diabetes Other   . Heart attack Other    Social History  Substance Use Topics  . Smoking status: Current Every Day Smoker -- 1.00 packs/day    Types: Cigarettes  . Smokeless tobacco: Never Used  . Alcohol Use: No    Review of Systems  Constitutional: Negative for fever and appetite change.  HENT: Positive for dental problem. Negative for congestion, facial swelling, sore throat and trouble swallowing.   Eyes: Negative for pain and visual disturbance.  Musculoskeletal: Negative for neck pain and neck stiffness.  Neurological: Negative for dizziness, facial asymmetry and headaches.  Hematological: Negative for adenopathy.  All other systems reviewed and are negative.     Allergies  Clindamycin/lincomycin and Sulfa antibiotics  Home Medications   Prior to Admission medications   Medication Sig Start Date End Date  Taking? Authorizing Provider  amLODipine (NORVASC) 5 MG tablet Take 1 tablet (5 mg total) by mouth daily. 07/25/15  Yes Vanetta Mulders, MD  benazepril (LOTENSIN) 40 MG tablet Take 1 tablet (40 mg total) by mouth 2 (two) times daily. 07/25/15  Yes Vanetta Mulders, MD  DULoxetine (CYMBALTA) 60 MG capsule Take 60 mg by mouth daily.   Yes Historical Provider, MD  hydroxypropyl methylcellulose / hypromellose (ISOPTO TEARS / GONIOVISC) 2.5 % ophthalmic solution Place 1 drop into both eyes as needed for dry eyes.   Yes Historical Provider, MD  ibuprofen (ADVIL,MOTRIN) 200 MG tablet Take 800 mg by mouth every 6 (six) hours as needed for mild pain.   Yes Historical Provider, MD  indomethacin (INDOCIN) 50 MG capsule Take 1 capsule (50 mg total) by mouth 2 (two) times daily with a meal. Patient taking differently: Take 50 mg by mouth 2 (two) times daily as needed for mild pain.  04/14/15  Yes Burgess Amor, PA-C  bisoprolol-hydrochlorothiazide (ZIAC) 5-6.25 MG per tablet Take 1 tablet by mouth 2 (two) times daily. 01/21/15   Mishka Stegemann, PA-C  clindamycin (CLEOCIN) 150 MG capsule Take 1 capsule (150 mg total) by mouth every 6 (six) hours. 08/11/15   Elson Areas, PA-C  cyclobenzaprine (FLEXERIL) 5 MG tablet Take 1 tablet (5 mg total) by mouth 3 (three) times daily as needed for muscle spasms. Patient not taking: Reported on 04/14/2015 02/25/15   Burgess Amor, PA-C  doxycycline (VIBRAMYCIN)  100 MG capsule Take 1 capsule (100 mg total) by mouth 2 (two) times daily. Patient not taking: Reported on 07/25/2015 05/29/15   Glynn OctaveStephen Rancour, MD  HYDROcodone-acetaminophen (NORCO/VICODIN) 5-325 MG tablet Take 1 tablet by mouth every 4 (four) hours as needed. Patient not taking: Reported on 07/25/2015 05/29/15   Glynn OctaveStephen Rancour, MD   BP 185/95 mmHg  Pulse 92  Temp(Src) 99 F (37.2 C) (Oral)  Resp 18  Ht 6\' 1"  (1.854 m)  Wt 129.275 kg  BMI 37.61 kg/m2  SpO2 98% Physical Exam  Constitutional: He is oriented to person,  place, and time. He appears well-developed and well-nourished. No distress.  HENT:  Head: Normocephalic and atraumatic.  Right Ear: Tympanic membrane and ear canal normal.  Left Ear: Tympanic membrane and ear canal normal.  Mouth/Throat: Uvula is midline, oropharynx is clear and moist and mucous membranes are normal. No trismus in the jaw. Dental caries present. No dental abscesses or uvula swelling.  Tenderness of the right lower second molar. Poor dentition, lower teeth black and decayed.  Upper dentures.  No facial swelling, obvious dental abscess, trismus, or sublingual abnml.    Neck: Normal range of motion. Neck supple.  Cardiovascular: Normal rate, regular rhythm and normal heart sounds.   No murmur heard. Pulmonary/Chest: Effort normal and breath sounds normal.  Musculoskeletal: Normal range of motion.  Lymphadenopathy:    He has no cervical adenopathy.  Neurological: He is alert and oriented to person, place, and time. He exhibits normal muscle tone. Coordination normal.  Skin: Skin is warm and dry.  Nursing note and vitals reviewed.   ED Course  Procedures (including critical care time) Labs Review Labs Reviewed - No data to display  Imaging Review No results found. I have personally reviewed and evaluated these images and lab results as part of my medical decision-making.   EKG Interpretation None      MDM   Final diagnoses:  Pain, dental  Medication refill   Pt well appearing.  Vitals stable.  No concerning sx's for dental abscess or Ludwig's angina.  Referral info for local dentistry and [facility] for primary care.     Pauline Ausammy Rhea Thrun, PA-C 10/01/15 2359  Donnetta HutchingBrian Cook, MD 10/02/15 [phone]

## 2015-10-02 ENCOUNTER — Encounter (HOSPITAL_COMMUNITY): Payer: Self-pay | Admitting: Emergency Medicine

## 2015-10-02 ENCOUNTER — Emergency Department (HOSPITAL_COMMUNITY)
Admission: EM | Admit: 2015-10-02 | Discharge: 2015-10-02 | Disposition: A | Discharge status: Home / Self Care | Payer: 59 | Attending: Emergency Medicine | Admitting: Emergency Medicine

## 2015-10-02 DIAGNOSIS — F17210 Nicotine dependence, cigarettes, uncomplicated: Secondary | ICD-10-CM | POA: Insufficient documentation

## 2015-10-02 DIAGNOSIS — Z79899 Other long term (current) drug therapy: Secondary | ICD-10-CM | POA: Diagnosis not present

## 2015-10-02 DIAGNOSIS — K029 Dental caries, unspecified: Secondary | ICD-10-CM | POA: Diagnosis not present

## 2015-10-02 DIAGNOSIS — I10 Essential (primary) hypertension: Secondary | ICD-10-CM | POA: Insufficient documentation

## 2015-10-02 DIAGNOSIS — R590 Localized enlarged lymph nodes: Secondary | ICD-10-CM | POA: Insufficient documentation

## 2015-10-02 DIAGNOSIS — K0889 Other specified disorders of teeth and supporting structures: Secondary | ICD-10-CM | POA: Diagnosis present

## 2015-10-02 MED ORDER — HYDROCODONE-ACETAMINOPHEN 5-325 MG PO TABS
2.0000 | ORAL_TABLET | Freq: Once | ORAL | Status: AC
  Administered 2015-10-02: 2 via ORAL
  Filled 2015-10-02: qty 2

## 2015-10-02 NOTE — ED Provider Notes (Signed)
CSN: [id number]     Arrival date & time 10/02/15  1905 History   First MD Initiated Contact with Patient 10/02/15 1913     Chief Complaint  Patient presents with  . Dental Pain     (Consider location/radiation/quality/duration/timing/severity/associated sxs/prior Treatment) Patient is a 45 y.o. male presenting with tooth pain. The history is provided by the patient.  Dental Pain Location:  Lower Lower teeth location:  17/LL 3rd molar, 18/LL 2nd molar and 19/LL 1st molar Quality:  Throbbing Severity:  Severe Onset quality:  Gradual Duration:  5 days Timing:  Constant Progression:  Worsening Chronicity:  Chronic Context: abscess   Worsened by:  Cold food/drink, jaw movement and pressure Ineffective treatments:  Acetaminophen and NSAIDs Associated symptoms: facial pain   Associated symptoms: no trismus   Risk factors: lack of dental care and smoking    Dave Rowland is a 45 y.o. male who presents to the ED with pain to the lower right dental area that started 4 days ago. He was evaluated here and started on Penicillin and Vicodin. He has been taking ibuprofen 800 mg. TID.  He ran out of his Vicodin and does not have a dental appointment until April 3rd. He is requesting something for pain. 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  Past Medical History  Diagnosis Date  . Hypertension   . Gout   . Anxiety    Past Surgical History  Procedure Laterality Date  . Knee arthrocentesis    . Shoulder arthroscopy    . Appendectomy     Family History  Problem Relation Age of Onset  . Hypertension Mother   . Cancer Mother   . Heart failure Father   . Hypertension Other   . Diabetes Other   . Heart attack Other    Social History  Substance Use Topics  . Smoking status: Current Every Day Smoker -- 1.00 packs/day    Types: Cigarettes  . Smokeless tobacco: Never Used  . Alcohol Use: No    Review of Systems  HENT: Positive for  dental problem.   all other systems negative    Allergies  Clindamycin/lincomycin and Sulfa antibiotics  Home Medications   Prior to Admission medications   Medication Sig Start Date End Date Taking? Authorizing Provider  amLODipine (NORVASC) 5 MG tablet Take 1 tablet (5 mg total) by mouth daily. 07/25/15   Vanetta Mulders, MD  benazepril (LOTENSIN) 40 MG tablet Take 1 tablet (40 mg total) by mouth 2 (two) times daily. 07/25/15   Vanetta Mulders, MD  bisoprolol-hydrochlorothiazide (ZIAC) 5-6.25 MG tablet Take 1 tablet by mouth daily. 09/28/15   Tammy Triplett, PA-C  clindamycin (CLEOCIN) 150 MG capsule Take 1 capsule (150 mg total) by mouth every 6 (six) hours. 08/11/15   Elson Areas, PA-C  cyclobenzaprine (FLEXERIL) 5 MG tablet Take 1 tablet (5 mg total) by mouth 3 (three) times daily as needed for muscle spasms. Patient not taking: Reported on 04/14/2015 02/25/15   Burgess Amor, PA-C  doxycycline (VIBRAMYCIN) 100 MG capsule Take 1 capsule (100 mg total) by mouth 2 (two) times daily. Patient not taking: Reported on 07/25/2015 05/29/15   Glynn Octave, MD  DULoxetine (CYMBALTA) 60 MG capsule Take 60 mg by mouth daily.    Historical Provider, MD  HYDROcodone-acetaminophen (NORCO/VICODIN) 5-325 MG tablet Take one-two tabs po q 4-6 hrs prn pain 09/28/15   Tammy Triplett, PA-C  hydroxypropyl methylcellulose / hypromellose (  ISOPTO TEARS / GONIOVISC) 2.5 % ophthalmic solution Place 1 drop into both eyes as needed for dry eyes.    Historical Provider, MD  ibuprofen (ADVIL,MOTRIN) 200 MG tablet Take 800 mg by mouth every 6 (six) hours as needed for mild pain.    Historical Provider, MD  indomethacin (INDOCIN) 50 MG capsule Take 1 capsule (50 mg total) by mouth 2 (two) times daily with a meal. Patient taking differently: Take 50 mg by mouth 2 (two) times daily as needed for mild pain.  04/14/15   Burgess AmorJulie Idol, PA-C  penicillin v potassium (VEETID) 500 MG tablet Take 1 tablet (500 mg total) by mouth 4 (four)  times daily. 09/28/15 10/05/15  Tammy Triplett, PA-C   BP 193/86 mmHg  Pulse 91  Temp(Src) 98.8 F (37.1 C) (Temporal)  Resp 20  SpO2 100% Physical Exam  Constitutional: He is oriented to person, place, and time. He appears well-developed and well-nourished. No distress.  HENT:  Head: Normocephalic.  Right Ear: Tympanic membrane normal.  Left Ear: Tympanic membrane normal.  Mouth/Throat: Uvula is midline, oropharynx is clear and moist and mucous membranes are normal.    All of bottom teeth area decayed, tender on exam of right molars. Erythema and tenderness of the gum surrounding the back teeth on the right.   Eyes: Conjunctivae and EOM are normal.  Neck: Normal range of motion. Neck supple.  Cardiovascular: Normal rate and regular rhythm.   Pulmonary/Chest: Effort normal. No respiratory distress. He has no wheezes.  Abdominal: Soft. There is no tenderness.  Musculoskeletal: Normal range of motion.  Lymphadenopathy:    He has cervical adenopathy (right).  Neurological: He is alert and oriented to person, place, and time. No cranial nerve deficit.  Skin: Skin is warm and dry.  Psychiatric: He has a normal mood and affect. His behavior is normal.  Nursing note and vitals reviewed.   ED Course  Procedures  Discussed with the patient that he had Vicodin on his last visit and we will not be continuing narcotic pain management.   MDM  45 y.o. male with chronic dental problems and decay here with dental pain due to carries, no abscess noted at this time. Patient stable for d/c without fever and does not appear toxic, no trismus, no facial swelling. Patient to continue Penicillin and ibuprofen.  Final diagnoses:  Pain due to dental caries       Janne NapoleonHope M Neese, NP 10/02/15 2024  Samuel JesterKathleen McManus, DO 10/05/15 1229

## 2015-10-02 NOTE — Discharge Instructions (Signed)
Call the dentist that you are scheduled to see in April and tell them if they have a cancellation before your appointment to call you. Continue to take the Penicillin and Ibuprofen. Use Ora gel topical pain medication.

## 2015-10-02 NOTE — ED Notes (Signed)
Pt c/o right lower jaw pain x 3 days

## 2015-11-13 ENCOUNTER — Emergency Department (HOSPITAL_COMMUNITY)
Admission: EM | Admit: 2015-11-13 | Discharge: 2015-11-14 | Disposition: A | Discharge status: Home / Self Care | Payer: 59 | Attending: Emergency Medicine | Admitting: Emergency Medicine

## 2015-11-13 ENCOUNTER — Emergency Department (HOSPITAL_COMMUNITY): Payer: 59

## 2015-11-13 ENCOUNTER — Encounter (HOSPITAL_COMMUNITY): Payer: Self-pay | Admitting: Emergency Medicine

## 2015-11-13 DIAGNOSIS — S60222A Contusion of left hand, initial encounter: Secondary | ICD-10-CM | POA: Insufficient documentation

## 2015-11-13 DIAGNOSIS — I10 Essential (primary) hypertension: Secondary | ICD-10-CM | POA: Diagnosis not present

## 2015-11-13 DIAGNOSIS — S6992XA Unspecified injury of left wrist, hand and finger(s), initial encounter: Secondary | ICD-10-CM | POA: Diagnosis present

## 2015-11-13 DIAGNOSIS — Y999 Unspecified external cause status: Secondary | ICD-10-CM | POA: Diagnosis not present

## 2015-11-13 DIAGNOSIS — S5012XA Contusion of left forearm, initial encounter: Secondary | ICD-10-CM | POA: Diagnosis not present

## 2015-11-13 DIAGNOSIS — F17210 Nicotine dependence, cigarettes, uncomplicated: Secondary | ICD-10-CM | POA: Diagnosis not present

## 2015-11-13 DIAGNOSIS — Y929 Unspecified place or not applicable: Secondary | ICD-10-CM | POA: Diagnosis not present

## 2015-11-13 DIAGNOSIS — S60212A Contusion of left wrist, initial encounter: Secondary | ICD-10-CM | POA: Diagnosis not present

## 2015-11-13 DIAGNOSIS — T07XXXA Unspecified multiple injuries, initial encounter: Secondary | ICD-10-CM

## 2015-11-13 DIAGNOSIS — S8001XA Contusion of right knee, initial encounter: Secondary | ICD-10-CM | POA: Insufficient documentation

## 2015-11-13 DIAGNOSIS — Y9389 Activity, other specified: Secondary | ICD-10-CM | POA: Diagnosis not present

## 2015-11-13 MED ORDER — HYDROCODONE-ACETAMINOPHEN 5-325 MG PO TABS
2.0000 | ORAL_TABLET | Freq: Once | ORAL | Status: AC
  Administered 2015-11-14: 2 via ORAL
  Filled 2015-11-13: qty 2

## 2015-11-13 MED ORDER — IBUPROFEN 800 MG PO TABS
800.0000 mg | ORAL_TABLET | Freq: Once | ORAL | Status: AC
Start: 2015-11-13 — End: 2015-11-14
  Administered 2015-11-14: 800 mg via ORAL
  Filled 2015-11-13: qty 1
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
## 2015-11-14 MED ORDER — HYDROCODONE-ACETAMINOPHEN 5-325 MG PO TABS: 1.0000 | ORAL_TABLET | Freq: Four times a day (QID) | ORAL | Status: AC | PRN

## 2015-11-14 MED ORDER — IBUPROFEN 800 MG PO TABS: 800.0000 mg | ORAL_TABLET | Freq: Three times a day (TID) | ORAL | Status: AC | PRN

## 2015-11-14 NOTE — ED Notes (Signed)
Pt given sling to elevate arm. Less pain when holding up per pt.
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
## 2015-11-14 NOTE — ED Notes (Signed)
Patient given discharge instruction, verbalized understand. Patient ambulatory out of the department.  
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
## 2018-05-22 ENCOUNTER — Emergency Department (HOSPITAL_COMMUNITY)
Admission: EM | Admit: 2018-05-22 | Discharge: 2018-05-22 | Disposition: A | Discharge status: Home / Self Care | Payer: BLUE CROSS/BLUE SHIELD | Attending: Emergency Medicine | Admitting: Emergency Medicine

## 2018-05-22 ENCOUNTER — Encounter (HOSPITAL_COMMUNITY): Payer: Self-pay | Admitting: Emergency Medicine

## 2018-05-22 ENCOUNTER — Other Ambulatory Visit: Payer: Self-pay

## 2018-05-22 DIAGNOSIS — M79605 Pain in left leg: Secondary | ICD-10-CM | POA: Diagnosis not present

## 2018-05-22 DIAGNOSIS — Z5321 Procedure and treatment not carried out due to patient leaving prior to being seen by health care provider: Secondary | ICD-10-CM | POA: Insufficient documentation

## 2018-05-22 NOTE — ED Notes (Signed)
Unable to obtain v/s during triage as pt refused, pt very anxious and wanted to walk outside stating "cold air helps with my anxiety", explained to pt the need for v/s, verbalized understanding, pt states he takes Xanax but has been out for 4 days, pt tearful during triage

## 2018-05-22 NOTE — ED Notes (Signed)
Pt called, not in waiting room 

## 2018-05-22 NOTE — ED Triage Notes (Signed)
Pt c/o LL leg pain since last night, denies injury, pt anxious during triage
